# Patient Record
Sex: Male | Born: 1985 | Race: White | Hispanic: No | Marital: Single | Smoking: Current every day smoker
Health system: Southern US, Community
[De-identification: ages and names within clinical notes are randomized; demographics above are authoritative.]

## PROBLEM LIST (undated history)

## (undated) DIAGNOSIS — J45909 Unspecified asthma, uncomplicated: Secondary | ICD-10-CM

## (undated) DIAGNOSIS — L719 Rosacea, unspecified: Secondary | ICD-10-CM

## (undated) DIAGNOSIS — R45851 Suicidal ideations: Secondary | ICD-10-CM

## (undated) DIAGNOSIS — F1910 Other psychoactive substance abuse, uncomplicated: Secondary | ICD-10-CM

---

## 2013-04-23 ENCOUNTER — Emergency Department (HOSPITAL_COMMUNITY)
Admission: EM | Admit: 2013-04-23 | Discharge: 2013-04-23 | Disposition: A | Discharge status: Home / Self Care | Payer: Self-pay | Attending: Emergency Medicine | Admitting: Emergency Medicine

## 2013-04-23 ENCOUNTER — Encounter (HOSPITAL_COMMUNITY): Payer: Self-pay | Admitting: *Deleted

## 2013-04-23 DIAGNOSIS — L719 Rosacea, unspecified: Secondary | ICD-10-CM | POA: Insufficient documentation

## 2013-04-23 DIAGNOSIS — F17200 Nicotine dependence, unspecified, uncomplicated: Secondary | ICD-10-CM | POA: Insufficient documentation

## 2013-04-23 DIAGNOSIS — J45909 Unspecified asthma, uncomplicated: Secondary | ICD-10-CM | POA: Insufficient documentation

## 2013-04-23 HISTORY — DX: Rosacea, unspecified: L71.9

## 2013-04-23 HISTORY — DX: Unspecified asthma, uncomplicated: J45.909

## 2013-04-23 MED ORDER — METRONIDAZOLE 500 MG PO TABS: 500.0000 mg | ORAL_TABLET | Freq: Two times a day (BID) | ORAL | Status: DC

## 2013-04-23 NOTE — ED Provider Notes (Signed)
CSN: [id number]     Arrival date & time 04/23/13  4098 History   First MD Initiated Contact with Patient 04/23/13 [phone]     Chief Complaint  Patient presents with  . Rash   (Consider location/radiation/quality/duration/timing/severity/associated sxs/prior Treatment) HPI Comments: Patient presents to the ER for evaluation of facial rash. Patient reports that in the last few days he has noticed redness, swelling around the nose and cheeks. He has been diagnosed with rosacea before. Patient denies the rash being anywhere else. No new skin products. No trouble breathing.  Patient is a 27 y.o. male presenting with rash.  Rash Associated symptoms: no fever     Past Medical History  Diagnosis Date  . Asthma    History reviewed. No pertinent past surgical history. No family history on file. History  Substance Use Topics  . Smoking status: Current Every Day Smoker  . Smokeless tobacco: Not on file  . Alcohol Use: No    Review of Systems  Constitutional: Negative for fever.  Skin: Positive for rash.    Allergies  Review of patient's allergies indicates no known allergies.  Home Medications  No current outpatient prescriptions on file. BP 128/72  Pulse 52  Temp(Src) 97.4 F (36.3 C) (Oral)  Resp 18  SpO2 97% Physical Exam  Constitutional: He is oriented to person, place, and time. He appears well-developed and well-nourished. No distress.  HENT:  Head: Normocephalic and atraumatic.  Right Ear: Hearing normal.  Left Ear: Hearing normal.  Nose: Nose normal.  Mouth/Throat: Oropharynx is clear and moist and mucous membranes are normal.  Eyes: Conjunctivae and EOM are normal. Pupils are equal, round, and reactive to light.  Neck: Normal range of motion. Neck supple.  Cardiovascular: Regular rhythm, S1 normal and S2 normal.  Exam reveals no gallop and no friction rub.   No murmur heard. Pulmonary/Chest: Effort normal and breath sounds normal. No respiratory distress. He exhibits  no tenderness.  Abdominal: Soft. Normal appearance and bowel sounds are normal. There is no hepatosplenomegaly. There is no tenderness. There is no rebound, no guarding, no tenderness at McBurney's point and negative Murphy's sign. No hernia.  Musculoskeletal: Normal range of motion.  Neurological: He is alert and oriented to person, place, and time. He has normal strength. No cranial nerve deficit or sensory deficit. Coordination normal. GCS eye subscore is 4. GCS verbal subscore is 5. GCS motor subscore is 6.  Skin: Skin is warm, dry and intact. Rash (erythematous, papular rash on nose and malar area. No vesicles.) noted. No cyanosis.  Psychiatric: He has a normal mood and affect. His speech is normal and behavior is normal. Thought content normal.    ED Course  Procedures (including critical care time) Labs Review Labs Reviewed - No data to display Imaging Review No results found.  MDM  Diagnosis: Rosacea  Patient presents with facial rash and a history of rosacea. Rash does seem consistent with rosacea, will treat. Patient with no insurance, living in half-way house. Unlikely to be able to afford doxy - will use flagyl.    Gilda Crease, MD 04/23/13 [phone]

## 2013-04-23 NOTE — ED Notes (Signed)
Pt is here with facial rash  And has had this before and got anbx for it

## 2013-06-06 ENCOUNTER — Encounter (HOSPITAL_COMMUNITY): Payer: Self-pay | Admitting: Emergency Medicine

## 2013-06-06 ENCOUNTER — Emergency Department (HOSPITAL_COMMUNITY)
Admission: EM | Admit: 2013-06-06 | Discharge: 2013-06-06 | Disposition: A | Discharge status: Home / Self Care | Payer: Self-pay | Attending: Emergency Medicine | Admitting: Emergency Medicine

## 2013-06-06 DIAGNOSIS — F17200 Nicotine dependence, unspecified, uncomplicated: Secondary | ICD-10-CM | POA: Insufficient documentation

## 2013-06-06 DIAGNOSIS — Z872 Personal history of diseases of the skin and subcutaneous tissue: Secondary | ICD-10-CM | POA: Insufficient documentation

## 2013-06-06 DIAGNOSIS — Z79899 Other long term (current) drug therapy: Secondary | ICD-10-CM | POA: Insufficient documentation

## 2013-06-06 DIAGNOSIS — Z760 Encounter for issue of repeat prescription: Secondary | ICD-10-CM | POA: Insufficient documentation

## 2013-06-06 DIAGNOSIS — J45909 Unspecified asthma, uncomplicated: Secondary | ICD-10-CM | POA: Insufficient documentation

## 2013-06-06 MED ORDER — ALBUTEROL SULFATE HFA 108 (90 BASE) MCG/ACT IN AERS
2.0000 | INHALATION_SPRAY | RESPIRATORY_TRACT | Status: DC | PRN
  Administered 2013-06-06: 2 via RESPIRATORY_TRACT
  Filled 2013-06-06: qty 6.7

## 2013-06-06 NOTE — ED Notes (Signed)
Pt presents to the ED with asthma "flare up" and is currently out of his albuterol inhaler

## 2013-06-06 NOTE — ED Provider Notes (Signed)
CSN: [id number]     Arrival date & time 06/06/13  0802 History   First MD Initiated Contact with Patient 06/06/13 0805     Chief Complaint  Patient presents with  . Asthma   (Consider location/radiation/quality/duration/timing/severity/associated sxs/prior Treatment) Patient is a 27 y.o. male presenting with asthma.  Asthma   Pt with history of asthma recently got out of prison and was given an inhaler when he was released. He reports he has been using his inhaler more frequently with recent weather change and has now run out. He does not yet have PCP and was advised to come here for evaluation. Denies symptoms now. Used his last puff this AM.   Past Medical History  Diagnosis Date  . Asthma   . Rosacea    No past surgical history on file. No family history on file. History  Substance Use Topics  . Smoking status: Current Every Day Smoker  . Smokeless tobacco: Not on file  . Alcohol Use: No    Review of Systems All other systems reviewed and are negative except as noted in HPI.   Allergies  Review of patient's allergies indicates no known allergies.  Home Medications   Current Outpatient Rx  Name  Route  Sig  Dispense  Refill  . albuterol (PROVENTIL HFA;VENTOLIN HFA) 108 (90 BASE) MCG/ACT inhaler   Inhalation   Inhale 2 puffs into the lungs every 6 (six) hours as needed for wheezing.         . metroNIDAZOLE (FLAGYL) 500 MG tablet   Oral   Take 1 tablet (500 mg total) by mouth 2 (two) times daily.   20 tablet   0    BP 113/66  Pulse 66  SpO2 100% Physical Exam  Nursing note and vitals reviewed. Constitutional: He is oriented to person, place, and time. He appears well-developed and well-nourished.  HENT:  Head: Normocephalic and atraumatic.  Eyes: EOM are normal. Pupils are equal, round, and reactive to light.  Neck: Normal range of motion. Neck supple.  Cardiovascular: Normal rate, normal heart sounds and intact distal pulses.   Pulmonary/Chest: Effort  normal and breath sounds normal. He has no wheezes. He has no rales.  Abdominal: Bowel sounds are normal. He exhibits no distension. There is no tenderness.  Musculoskeletal: Normal range of motion. He exhibits no edema and no tenderness.  Neurological: He is alert and oriented to person, place, and time. He has normal strength. No cranial nerve deficit or sensory deficit.  Skin: Skin is warm and dry. No rash noted.  Psychiatric: He has a normal mood and affect.    ED Course  Procedures (including critical care time) Labs Review Labs Reviewed - No data to display Imaging Review No results found.  EKG Interpretation   None       MDM   1. Medication refill   2. Asthma     Given albuterol HFA here and referral to [facility] for long term management.     Joniya Boberg B. Bernette Mayers, MD 06/06/13 [phone]

## 2013-06-06 NOTE — ED Notes (Signed)
Pt reports he is out of his albuterol inhaler, does not have a PCP to get a refill of his inhaler.

## 2013-06-06 NOTE — ED Notes (Signed)
Pt discharged home with all belongings, alert and ambulatory upon discharge, 1 new RX given, pt verbalizes understanding of discharge instructions, driven home by friend at bedside

## 2013-06-29 ENCOUNTER — Emergency Department (HOSPITAL_COMMUNITY)
Admission: EM | Admit: 2013-06-29 | Discharge: 2013-06-29 | Disposition: A | Discharge status: Home / Self Care | Payer: Self-pay | Attending: Emergency Medicine | Admitting: Emergency Medicine

## 2013-06-29 ENCOUNTER — Encounter (HOSPITAL_COMMUNITY): Payer: Self-pay | Admitting: Emergency Medicine

## 2013-06-29 DIAGNOSIS — Z872 Personal history of diseases of the skin and subcutaneous tissue: Secondary | ICD-10-CM | POA: Insufficient documentation

## 2013-06-29 DIAGNOSIS — J45909 Unspecified asthma, uncomplicated: Secondary | ICD-10-CM | POA: Insufficient documentation

## 2013-06-29 DIAGNOSIS — F17200 Nicotine dependence, unspecified, uncomplicated: Secondary | ICD-10-CM | POA: Insufficient documentation

## 2013-06-29 DIAGNOSIS — Z760 Encounter for issue of repeat prescription: Secondary | ICD-10-CM | POA: Insufficient documentation

## 2013-06-29 MED ORDER — ALBUTEROL SULFATE HFA 108 (90 BASE) MCG/ACT IN AERS
2.0000 | INHALATION_SPRAY | RESPIRATORY_TRACT | Status: DC | PRN
  Administered 2013-06-29: 2 via RESPIRATORY_TRACT
  Filled 2013-06-29: qty 6.7

## 2013-06-29 MED ORDER — ALBUTEROL SULFATE HFA 108 (90 BASE) MCG/ACT IN AERS
2.0000 | INHALATION_SPRAY | RESPIRATORY_TRACT | Status: AC | PRN
Start: 2013-06-29 — End: ?

## 2013-06-29 NOTE — ED Provider Notes (Signed)
CSN: [id number]     Arrival date & time 06/29/13  2103 History   First MD Initiated Contact with Patient 06/29/13 2256     Chief Complaint  Patient presents with  . Medication Refill   (Consider location/radiation/quality/duration/timing/severity/associated sxs/prior Treatment) HPI  27 year old male presents requesting for refill of albuterol inhaler.  Sts he has hx of asthma, uses albuterol inhaler on a daily basis.  Ran out of inhaler since yesterday and currently waiting to get paid on Monday in order to pay for inhaler refill, which he did not receive.  He currently denies any active sxs including no fever, chills, cp, sob, productive cough or rash.  Is a smoker.  No prior intubation or ICU stay.  No other complaint.    Past Medical History  Diagnosis Date  . Asthma   . Rosacea    History reviewed. No pertinent past surgical history. History reviewed. No pertinent family history. History  Substance Use Topics  . Smoking status: Current Every Day Smoker  . Smokeless tobacco: Not on file  . Alcohol Use: No    Review of Systems  Constitutional: Negative for fever.  Respiratory: Negative for cough, chest tightness and shortness of breath.   Cardiovascular: Negative for chest pain.  Skin: Negative for rash.    Allergies  Review of patient's allergies indicates no known allergies.  Home Medications  No current outpatient prescriptions on file. BP 121/66  Pulse 64  Temp(Src) 97.5 F (36.4 C) (Oral)  Resp 18  Wt 156 lb 1 oz (70.789 kg)  SpO2 97% Physical Exam  Nursing note and vitals reviewed. Constitutional: He appears well-developed and well-nourished.  HENT:  Head: Atraumatic.  Mouth/Throat: Oropharynx is clear and moist.  Eyes: Conjunctivae are normal.  Neck: Normal range of motion. Neck supple.  Cardiovascular: Normal rate and regular rhythm.   Pulmonary/Chest: Effort normal and breath sounds normal. He has no wheezes. He exhibits no tenderness.  Neurological:  He is alert.  Skin: No rash noted.  Psychiatric: He has a normal mood and affect.    ED Course  Procedures (including critical care time)  11:35 PM Pt here requesting for albuterol HFA refill.  No active respiratory illness.  Will give inhaler here, give prescription along with list of resources.  Smoking cessation discussed.    Labs Review Labs Reviewed - No data to display Imaging Review No results found.  EKG Interpretation   None       MDM   1. Encounter for medication refill    BP 121/66  Pulse 64  Temp(Src) 97.5 F (36.4 C) (Oral)  Resp 18  Wt 156 lb 1 oz (70.789 kg)  SpO2 97%     Fayrene Helper, PA-C 06/29/13 2338

## 2013-06-29 NOTE — ED Provider Notes (Signed)
Medical screening examination/treatment/procedure(s) were performed by non-physician practitioner and as supervising physician I was immediately available for consultation/collaboration.   Charles B. Sheldon, MD 06/29/13 2355 

## 2013-06-29 NOTE — ED Notes (Addendum)
Presents with requesting refill on albuterol inhaler and new hand held inhaler until he can pay for one on Monday. Bilateral breath sounds clear.  Speaking in full sentences, sats WNL, RR WNL

## 2013-08-24 ENCOUNTER — Emergency Department (HOSPITAL_COMMUNITY)
Admission: EM | Admit: 2013-08-24 | Discharge: 2013-08-24 | Disposition: A | Discharge status: Home / Self Care | Payer: Self-pay | Attending: Emergency Medicine | Admitting: Emergency Medicine

## 2013-08-24 ENCOUNTER — Encounter (HOSPITAL_COMMUNITY): Payer: Self-pay | Admitting: Emergency Medicine

## 2013-08-24 DIAGNOSIS — J45901 Unspecified asthma with (acute) exacerbation: Secondary | ICD-10-CM

## 2013-08-24 DIAGNOSIS — F17200 Nicotine dependence, unspecified, uncomplicated: Secondary | ICD-10-CM | POA: Insufficient documentation

## 2013-08-24 DIAGNOSIS — Z872 Personal history of diseases of the skin and subcutaneous tissue: Secondary | ICD-10-CM | POA: Insufficient documentation

## 2013-08-24 DIAGNOSIS — J45909 Unspecified asthma, uncomplicated: Secondary | ICD-10-CM | POA: Insufficient documentation

## 2013-08-24 MED ORDER — ALBUTEROL SULFATE HFA 108 (90 BASE) MCG/ACT IN AERS
2.0000 | INHALATION_SPRAY | Freq: Once | RESPIRATORY_TRACT | Status: AC
  Administered 2013-08-24: 2 via RESPIRATORY_TRACT
  Filled 2013-08-24: qty 6.7

## 2013-08-24 MED ORDER — ALBUTEROL SULFATE HFA 108 (90 BASE) MCG/ACT IN AERS: 1.0000 | INHALATION_SPRAY | Freq: Four times a day (QID) | RESPIRATORY_TRACT | Status: DC | PRN

## 2013-08-24 NOTE — Discharge Instructions (Signed)
Follow up with a doctor from the resource guide below. Return to the ED with worsening or concerning symptoms. Use albuterol inhaler as needed.    Emergency Department Resource Guide 1) Find a Doctor and Pay Out of Pocket Although you won't have to find out who is covered by your insurance plan, it is a good idea to ask around and get recommendations. You will then need to call the office and see if the doctor you have chosen will accept you as a new patient and what types of options they offer for patients who are self-pay. Some doctors offer discounts or will set up payment plans for their patients who do not have insurance, but you will need to ask so you aren't surprised when you get to your appointment.  2) Contact Your Local Health Department Not all health departments have doctors that can see patients for sick visits, but many do, so it is worth a call to see if yours does. If you don't know where your local health department is, you can check in your phone book. The CDC also has a tool to help you locate your state's health department, and many state websites also have listings of all of their local health departments.  3) Find a Walk-in Clinic If your illness is not likely to be very severe or complicated, you may want to try a walk in clinic. These are popping up all over the country in pharmacies, drugstores, and shopping centers. They're usually staffed by nurse practitioners or physician assistants that have been trained to treat common illnesses and complaints. They're usually fairly quick and inexpensive. However, if you have serious medical issues or chronic medical problems, these are probably not your best option.  No Primary Care Doctor: - Call Health Connect at  [phone] - they can help you locate a primary care doctor that  accepts your insurance, provides certain services, etc. - Physician Referral Service- [phone]  Chronic Pain Problems: Organization          Address  Phone   Notes  [facility]  [phone] Patients need to be referred by their primary care doctor.   Medication Assistance: Organization         Address  Phone   Notes  [facility] [address] [place], [place] [address] [phone] --Must be a resident of [facility] -- Must have NO insurance coverage whatsoever (no Medicaid/ Medicare, etc.) -- The pt. MUST have a primary care doctor that directs their care regularly and follows them in the community   [facility]ist  [phone]   [employer]  [phone]    Agencies that provide inexpensive medical care: Organization         Address  Phone   Notes  [facility]  [phone]   [facility]    [phone]   [facility] [address] [place], [place] [address] [phone]   [facility] [address], [place] [phone]   [facility]    [phone]   [facility]    [phone]   [facility]  [address], [place] Phone:  [phone], Fax:  [phone] Hours of Operation:  9 am - 6 pm, M-F.  Also accepts Medicaid/Medicare and self-pay.  [facility]  [address], [place]  Phone: [phone], Fax: [phone]. Hours of Operation:  8:30 am - 5:30 pm, M-F.  Also accepts Medicaid and self-pay.  °[facility] [place] [address], [place] Phone: [phone]   °[facility] [address], [place], [place] [phone], Ext. 123 Mondays & Thursdays: 7-9 AM.  First 15 patients are seen on a first come, first serve basis. °  ° °Medicaid-accepting [place] Providers: ° °Organization         Address  Phone   Notes  °[facility] [address], [place] [phone] Also accepts self-pay patients.  °[facility] [address], [place] ° [phone]) [phone]   °[facility] [address], [place] [phone]) [phone]   °[facility] [address] [place] [address], [place] [phone]) [phone]   °Veita Bland [address], [place]  ° [phone]) [phone] Only accepts [place] Access Medicaid patients after they have their name applied to their card.  ° °Self-Pay (no insurance) in [place]: ° °Organization         Address  Phone   Notes  °Sickle Cell Patients, [facility] [address], [place] [phone]) [phone]   °[facility] [address], [place] [phone]) [phone]   °[facility] ° [address], [place] [phone]) [phone]   °[facility]/Dr. Osei-Bonsu ° [address] [place] [address], [place] or [address], [place] [phone] Phone number for both [place] and [place] locations is the same.  °[facility] [address], [place] [phone]   °[facility] [address] [place] [address], [place] or [address] ([phone]) [phone] °([phone]) [phone]   °[facility] [address], [place] ([phone]) [phone], phone; [phone], fax Sees patients 1st and 3rd Saturday of every month.  Must not qualify for public or private insurance (i.e. Medicaid, Medicare, [place] Health Choice, Veterans' Benefits) • Household income should be no more than 200% of the poverty level •The clinic cannot treat you if you are pregnant or think you are pregnant • Sexually transmitted diseases are not treated at the clinic.  ° ° °Dental Care: °Organization         Address  Phone  Notes  °[facility] [facility] [address], [place] [phone]) [phone] Accepts children up to age 21 who are enrolled in Medicaid or [place] Health Choice; pregnant women with a Medicaid card; and  children who have applied for Medicaid or [place] Health Choice, but were declined, whose parents can pay a reduced fee at time of service.  °[facility] [place]  [address], [place] [phone]) [phone] Accepts children up to age 21 who are enrolled in Medicaid or [place] Health Choice; pregnant women with a Medicaid card; and children who have applied for Medicaid or  Health Choice, but were declined, whose parents can pay a reduced fee at time of service.  °[facility] ° [address],  ([phone]) [phone] Patients are seen by appointment only. Walk-ins are not accepted. [facility] will see patients 18 years of age and older. °Monday - Tuesday (8am-5pm) °Most Wednesdays (8:30-5pm) °$30 per visit, cash only  °[facility] ° [address], [facility] [phone] Patients are seen by appointment only. Walk-ins are not accepted. [place] will see patients 23 years of age and older. One Wednesday Evening (Monthly: Volunteer Based).  $30 per visit, cash only  [place]  [phone] for adults; Children under age 68, call Graduate Pediatric Dentistry at [phone]. Children aged 23-14, please call [phone] to request a pediatric application.  Dental services are provided in all areas of dental care including fillings, crowns and bridges, complete and partial dentures, implants, gum treatment, root canals, and extractions. Preventive care is also provided. Treatment is provided to both adults and children. Patients are selected via a lottery and there is often a waiting list.   [facility] [address] [place]  [phone] [email or web address]   [facility] [address] [place], [place] [phone], Ext. [phone] Second and Fourth Thursday of each month, opens at 6:30 AM; Clinic ends at 9 AM.  Patients are seen on a first-come first-served  basis, and a limited number are seen during each clinic.   [facility]  [address] Hillard Danker [place], [place] [phone]   Eligibility Requirements You must have lived in [place], [place], or [place] counties for at least the last three months.   You cannot be eligible for state or federal sponsored [employer], including [employer], [place], or [employer].   You generally cannot be eligible for healthcare insurance through your employer.    How to apply: Eligibility screenings are held every Tuesday and Wednesday afternoon from 1:00 pm until 4:00 pm. You do not need an appointment for the interview!  [facility] [address], [place], [place]   [place]  [place]  [phone]    Behavioral Health Resources in the Community: Intensive Outpatient Programs Organization         Address  Phone  Notes  [facility] [address], [place], [place] [phone]   [facility] [address] [place], [place]   [facility] [address] [place], [place]   [place] [address]  [place], [place] or [phone]   Substance Abuse Resources Organization         Address  Phone  Notes  [facility]  [phone]) [phone]   [place]  ([phone]) [phone]   [facility] [place]   Chinita Pester  [phone]   Residential & Outpatient Substance Abuse Program  ([phone]   Psychological Services Organization         Address  Phone  Notes  [facility] [place]  [place]  ([phone]) [phone]   [place] [address], [place] or [phone]    Mobile Crisis Teams Organization          Address  Phone  Notes  [facility], Mobile Crisis Care Unit  [phone]   [facility]  [address] [employer] [employer], [place]   [place] [address] [place] [phone]    Self-Help/Support Groups Organization         Address  Phone             Notes  [place] [facility] [place] - variety of  support groups  336- [phone] Call for more information  °Narcotics Anonymous (NA), [facility] [address], °[place]  2 meetings at this location  ° °Residential Treatment Programs °Organization         Address  Phone  Notes  °[facility] [address],    °[place]  [phone]   °[facility] ° [address], [place], [place] [phone]   °[facility] [address], [place] [phone] Admissions: 8am-3pm M-F  °[facility] [address]    °[place], [place] [phone]   °[facility] [address], [place], [place] [phone]   °[facility] [address]  °[place], [place] [phone]   °[facility] - Intensive Outpatient [address], [place], [place] [phone]   °[facility] [address]  °[place], [place] 1-877-615-2722 or [phone]   °[facility] ([facility]) [address] [place], [place] [phone] Accepts Medicaid  °[facility] [address]  °[place] 1-800-659-3381 Substance Abuse/Addiction Treatment  ° °[place] Behavioral Health Resources °Organization         Address  Phone  Notes  °[facility]  (888) [phone]   °Julie Brannon, PhD [address] [place], [place]   (336) [phone] or (336) 951-0000   °[facility]   [address] °[place], [place] (336) 349-4454   °[facility] [address], [place], [place] (336) [phone] Insurance/Medicaid/sponsorship  through [facility]  °[facility] [address]                                    [place], [place] (336) [phone] Therapy/tele-psych/case  °[facility] [address]  ° [place], [name] (336) [phone]    °Dr. Arfeen  (336) [phone]   °[facility] [place]  [employer] [place] [facility] 1) [address], [place] °2) [address], [place] °3)  [address], [place] (336) [phone] °(336) [phone] ° °(336) 342-8140   °[place] Child Abuse Hotline (336) 342-1394 or (336) 342-3537 (After Hours)    ° ° ° °

## 2013-08-24 NOTE — ED Notes (Signed)
RN went into patients room x3 times and patient was not in room. RN called for patient in waiting room x3 times. Patient did not answer. RN moved patient to OTF.

## 2013-08-24 NOTE — ED Provider Notes (Signed)
CSN: [id number]     Arrival date & time 08/24/13  1907 History  This chart was scribed for non-physician practitioner Emilia BeckKaitlyn Carloyn Lahue, PA-C working with Audree CamelScott T Goldston, MD by Danella Maiersaroline Early, ED Scribe. This patient was seen in room TR07C/TR07C and the patient's care was started at 7:27 PM.    Chief Complaint  Patient presents with  . wants albuterol inhaler    The history is provided by the patient. No language interpreter was used.   HPI Comments: Frank Patterson is a 28 y.o. male with a h/o asthma who presents to the Emergency Department for a refill on his albuterol inhaler.  He states he ran out today. He denies any wheezing, SOB, cough. He states he does not have a PCP and he comes here for his inhalers.    Past Medical History  Diagnosis Date  . Asthma   . Rosacea    History reviewed. No pertinent past surgical history. No family history on file. History  Substance Use Topics  . Smoking status: Current Every Day Smoker  . Smokeless tobacco: Not on file  . Alcohol Use: No    Review of Systems  Constitutional: Negative for fever, chills and fatigue.  HENT: Negative for trouble swallowing.   Eyes: Negative for visual disturbance.  Respiratory: Negative for cough, shortness of breath and wheezing.   Cardiovascular: Negative for chest pain and palpitations.  Gastrointestinal: Negative for nausea, vomiting, abdominal pain and diarrhea.  Endocrine: Negative for heat intolerance.  Genitourinary: Negative for dysuria and difficulty urinating.  Musculoskeletal: Negative for arthralgias and neck pain.  Skin: Negative for color change.  Neurological: Negative for dizziness and weakness.  Psychiatric/Behavioral: Negative for dysphoric mood.    Allergies  Review of patient's allergies indicates no known allergies.  Home Medications   Current Outpatient Rx  Name  Route  Sig  Dispense  Refill  . albuterol (PROVENTIL HFA;VENTOLIN HFA) 108 (90 BASE) MCG/ACT inhaler    Inhalation   Inhale 2 puffs into the lungs every 4 (four) hours as needed for wheezing or shortness of breath.   1 each   0    BP 123/66  Pulse 66  Temp(Src) 97.7 F (36.5 C)  Resp 18  Ht 5\' 6"  (1.676 m)  Wt 151 lb (68.493 kg)  BMI 24.38 kg/m2  SpO2 98% Physical Exam  Nursing note and vitals reviewed. Constitutional: He is oriented to person, place, and time. He appears well-developed and well-nourished. No distress.  HENT:  Head: Normocephalic and atraumatic.  Eyes: EOM are normal.  Neck: Neck supple. No tracheal deviation present.  Cardiovascular: Normal rate.   Pulmonary/Chest: Effort normal. No respiratory distress. He has no wheezes.  Musculoskeletal: Normal range of motion.  Neurological: He is alert and oriented to person, place, and time.  Skin: Skin is warm and dry.  Psychiatric: He has a normal mood and affect. His behavior is normal.    ED Course  Procedures (including critical care time) Medications - No data to display  DIAGNOSTIC STUDIES: Oxygen Saturation is 98% on RA, normal by my interpretation.    COORDINATION OF CARE: 8:44 PM- Discussed treatment plan with pt which includes discharge home with inhaler. Pt agrees to plan.    Labs Review Labs Reviewed - No data to display Imaging Review No results found.  EKG Interpretation   None       MDM   1. Asthma attack    Patient will have albuterol inhaler. Patient has no wheezing on exam. Vitals  stable and patient afebrile. Patient given a Facilities manager.   I personally performed the services described in this documentation, which was scribed in my presence. The recorded information has been reviewed and is accurate.    Emilia Beck, [place] 08/24/13 2113

## 2013-08-24 NOTE — ED Provider Notes (Signed)
Medical screening examination/treatment/procedure(s) were performed by non-physician practitioner and as supervising physician I was immediately available for consultation/collaboration.  EKG Interpretation   None         Salih Williamson T Della Homan, MD 08/24/13 2332 

## 2013-08-24 NOTE — ED Notes (Signed)
The pt is asking for an albuterol inhaler.  He reports that hecomes here and usually gets an inhaler and does not have the money to get a rx filled until he gets paid

## 2013-09-12 ENCOUNTER — Encounter (HOSPITAL_COMMUNITY): Payer: Self-pay | Admitting: Emergency Medicine

## 2013-09-12 ENCOUNTER — Emergency Department (HOSPITAL_COMMUNITY)
Admission: EM | Admit: 2013-09-12 | Discharge: 2013-09-12 | Disposition: A | Discharge status: Home / Self Care | Payer: Self-pay | Attending: Emergency Medicine | Admitting: Emergency Medicine

## 2013-09-12 DIAGNOSIS — F17200 Nicotine dependence, unspecified, uncomplicated: Secondary | ICD-10-CM | POA: Insufficient documentation

## 2013-09-12 DIAGNOSIS — Z760 Encounter for issue of repeat prescription: Secondary | ICD-10-CM | POA: Insufficient documentation

## 2013-09-12 DIAGNOSIS — Z872 Personal history of diseases of the skin and subcutaneous tissue: Secondary | ICD-10-CM | POA: Insufficient documentation

## 2013-09-12 DIAGNOSIS — J45909 Unspecified asthma, uncomplicated: Secondary | ICD-10-CM | POA: Insufficient documentation

## 2013-09-12 DIAGNOSIS — Z79899 Other long term (current) drug therapy: Secondary | ICD-10-CM | POA: Insufficient documentation

## 2013-09-12 MED ORDER — ALBUTEROL SULFATE HFA 108 (90 BASE) MCG/ACT IN AERS
2.0000 | INHALATION_SPRAY | Freq: Once | RESPIRATORY_TRACT | Status: AC
  Administered 2013-09-12: 2 via RESPIRATORY_TRACT
  Filled 2013-09-12: qty 6.7

## 2013-09-12 NOTE — ED Notes (Signed)
Pt requesting a medication refill for his albuterol inhaler, states he does not have a pcp and is currently out of his inhaler

## 2013-09-12 NOTE — Discharge Instructions (Signed)
Use albuterol inhaler as needed for shortness of breath. Follow up with a primary care provider from the resource guide for further evaluation and management.    Emergency Department Resource Guide 1) Find a Doctor and Pay Out of Pocket Although you won't have to find out who is covered by your insurance plan, it is a good idea to ask around and get recommendations. You will then need to call the office and see if the doctor you have chosen will accept you as a new patient and what types of options they offer for patients who are self-pay. Some doctors offer discounts or will set up payment plans for their patients who do not have insurance, but you will need to ask so you aren't surprised when you get to your appointment.  2) Contact Your Local Health Department Not all health departments have doctors that can see patients for sick visits, but many do, so it is worth a call to see if yours does. If you don't know where your local health department is, you can check in your phone book. The CDC also has a tool to help you locate your state's health department, and many state websites also have listings of all of their local health departments.  3) Find a Walk-in Clinic If your illness is not likely to be very severe or complicated, you may want to try a walk in clinic. These are popping up all over the country in pharmacies, drugstores, and shopping centers. They're usually staffed by nurse practitioners or physician assistants that have been trained to treat common illnesses and complaints. They're usually fairly quick and inexpensive. However, if you have serious medical issues or chronic medical problems, these are probably not your best option.  No Primary Care Doctor: - Call Health Connect at  [phone] - they can help you locate a primary care doctor that  accepts your insurance, provides certain services, etc. - Physician Referral Service- [phone]  Chronic Pain Problems: Organization          Address  Phone   Notes  [facility]  [phone] Patients need to be referred by their primary care doctor.   Medication Assistance: Organization         Address  Phone   Notes  [facility] Medication [facility] [address] [place], [place] [address] [phone] --Must be a resident of [facility] -- Must have NO insurance coverage whatsoever (no Medicaid/ Medicare, etc.) -- The pt. MUST have a primary care doctor that directs their care regularly and follows them in the community   MedAssist  [phone]   [employer]  [phone]    Agencies that provide inexpensive medical care: Organization         Address  Phone   Notes  [facility]  [phone]   [facility]    [phone]   [facility] [address] [place], [place] [address] [phone]   [facility] [address] [place], [place] [phone]) [phone]   Planned Parenthood    [phone]   [facility]    [phone]   [facility]  [address], [place] Phone:  [phone], Fax:  [phone] Hours of Operation:  9 am - 6 pm, M-F.  Also accepts Medicaid/Medicare and self-pay.  [facility]  [address],  [place] Phone: [phone](336) [phone], Fax: [phone]) [phone]. Hours of Operation:  8:30 am - 5:30 pm, M-F.  Also accepts Medicaid and self-pay.  [facility] [place] [address], [place] Phone: [phone]) [phone]   [facility] [address], [place], [place] [phone], Ext. [phone] Mondays & Thursdays: 7-9 AM.  First 15 patients are seen on a first come, first serve basis.    Medicaid-accepting [facility] Providers:  Organization         Address  Phone   Notes  [facility] [address], [place] [phone]) [phone] Also accepts self-pay patients.    [facility] [address], [place]  [phone]   [facility] [address], [place] [phone](336) [phone]   [facility] [address] [place] [address], [place] [phone]   Renaye RakersVeita Bland [address], [place]   [phone] Only accepts [place] Access [place]icaid patients after they have their name applied to their card.   Self-Pay (no insurance) in [facility] [place]:  Organization         Address  Phone   Notes  Sickle Cell Patients, [facility] [address], [place] [phone]) [phone]   [facility] [address], [place] [phone]   [facility] [place]  [address], [place] [phone]   [facility]/Dr. Osei-Bonsu  [address] [place] [address], [place] or [phone], [place] [phone] Phone number for both [place] and [place] locations is the same.  [facility] [address], [place] [phone]) [phone]   [facility] [place] [address] [place] [address], [place] or [address] [phone]   [facility] [address], [place] [phone]) [phone], phone; [phone], fax Sees patients 1st and 3rd Saturday of every month.  Must not qualify for public or private insurance (i.e. Medicaid, Medicare, [place] Health Choice, Veterans' Benefits)  Household income should be no more than 200% of the poverty level The clinic cannot treat you if you are pregnant or think you are pregnant  Sexually transmitted diseases are not treated at the clinic.    Dental Care: Organization         Address  Phone  Notes  [facility] Department of [facility] [address], [place] [phone] Accepts children up to age 29 who are enrolled in [place]icaid or [place] Health Choice; pregnant women with a Medicaid  card; and children who have applied for Medicaid or [place] Health Choice, but were declined, whose parents can pay a reduced fee at time of service.  [name] of [facility] [place]  [address], [place] [phone] Accepts children up to age 10821 who are enrolled in [place]icaid or [place] Health Choice; pregnant women with a Medicaid card; and children who have applied for Medicaid or [place] Health Choice, but were declined, whose parents can pay a reduced fee at time of service.  [facility]  [address], [place] [phone] Patients are seen by appointment only. Walk-ins are not accepted. [facility] will see patients 29 years of age and older. Monday - Tuesday (8am-5pm) Most Wednesdays (8:30-5pm) $30 per visit, cash only  [facility]  [address], [facility] [phone]) [phone] Patients are seen by appointment only. Walk-ins are not accepted. [facility] will see patients 29 years of age and older. One Wednesday Evening (Monthly: Volunteer Based).  $30 per visit, cash only  [employer] [facility] [facility]  [phone] for adults; Children under age 724, call Graduate Pediatric Dentistry at [phone]. Children aged 244-14, please call [phone] to request a pediatric application.  Dental services are provided in all areas of dental care including fillings, crowns and bridges, complete and partial dentures, implants, gum treatment, root canals, and extractions. Preventive care is also provided. Treatment is provided to both adults and children. Patients are selected via a lottery and there is often a waiting list.   [facility] [address], [place]  [phone] [email or web address]   [facility] [address], [place], [place] [phone], Ext. [phone] Second and Fourth Thursday of each month, opens at 6:30 AM; Clinic ends at 9 AM.  Patients are seen on a first-come  first-served basis, and a limited number are seen during each clinic.   [facility]  [address], [place], [place] [phone]) [phone]   Eligibility Requirements You must have lived in [place], [place], or [place] counties for at least the last three months.   You cannot be eligible for state or federal sponsored National Cityhealthcare insurance, including CIGNAVeterans Administration, [place]icaid, or [employer] EntertainmentMedicare.   You generally cannot be eligible for healthcare insurance through your employer.    How to apply: Eligibility screenings are held every Tuesday and Wednesday afternoon from 1:00 pm until 4:00 pm. You do not need an appointment for the interview!  [facility] [address], [place], [place] [phone]   [facility]  [phone]   [facility]  [phone]   [facility]  [phone]    Behavioral Health Resources in the Community: Intensive Outpatient Programs Organization         Address  Phone  Notes  [facility] [address], [place], [place] [phone]   [facility] [address], [place], [place] [phone]   [facility] S[facility] [address], [place], [place]  [phone]   [facility] [address],  [place], [place] [phone] or [phone]   Substance Abuse Resources Organization         Address  Phone  Notes  [facility] Drug Services  [phone]   [facility]  [phone]   [facility]  [phone]   [facility]  [phone]   Residential & Outpatient Substance Abuse Program  [phone]   Psychological Services Organization         Address  Phone  Notes  [facility]  [phone]- [phone]   [facility]  [phone]- [phone]   [facility] [address], [place] [phone] or [phone]    Mobile Crisis Teams Organization          Address  Phone  Notes  [facility], Mobile Crisis Care Unit  [phone]   [facility]  [address] [place], [place] [phone]   Doristine LocksSharon DeEsch [address] [place] [phone]    Self-Help/Support Groups Organization         Address  Phone             Notes  Mental Health Assoc. of [place] -  variety of support groups  [phone] Call for more information  Narcotics Anonymous (NA), Caring [facility] [address], [place]  2 meetings at this location   Residential Facilities manager         Address  Phone  Notes  [facility] [place],    [place]  [phone]   [name]  [address] [place] [id number], [place], [place]   [place], [place] [phone] Admissions: 8am-3pm M-F  Incentives Substance [place] [address]    [place], [place] [phone]   [facility] [address] [place], [place], [place]   [facility] [address]  [place], [place]   Insight Programs - Intensive Outpatient [address], [place], [place]   [facility] [address].,  [place], [place] [phone] or [phone]   [facility] (RTS) [address], [place], [place] Accepts Medicaid  [facility] [place] [address],  [place] [phone] Substance Abuse/Addiction Treatment   [facility] Organization         Address  Phone  Notes  [facility]  [phone]   Domenic Schwab, PhD [address] [place], [place]   [phone] or [phone]   [place] [address] [place], [place] [phone]   [facility] [address] [place], [place] [phone] Insurance/Medicaid/sponsorship  through [facility] and Families [address] [place]                                    [place], [place] [phone] [place] [address], [place] [phone]    Dr. Adele Schilder  [phone]   [facility] Dept. 1) [address]  2) [place] 3)  [place] [address], [place] [phone]  [phone]   [place] [phone] or [phone] (After Hours)

## 2013-09-12 NOTE — ED Provider Notes (Signed)
CSN: [id number]     Arrival date & time 09/12/13  1119 History   This chart was scribed for non-physician practitioner Emilia BeckKaitlyn Virgel Haro, PA-C, working with Shon Batonourtney F Horton, MD, by Yevette EdwardsAngela Bracken, ED Scribe. This patient was seen in room TR06C/TR06C and the patient's care was started at 12:26 PM.  First MD Initiated Contact with Patient 09/12/13 1150     Chief Complaint  Patient presents with  . Medication Refill    The history is provided by the patient. No language interpreter was used.   HPI Comments: Frank Patterson is a 28 y.o. Male, with a h/o asthma, who presents to the Emergency Department for a medication refill for albuterol inhaler. The pt denies a current asthma attack.  Frank Patterson is a current smoker.   He does not have a PCP.   Past Medical History  Diagnosis Date  . Asthma   . Rosacea    History reviewed. No pertinent past surgical history. History reviewed. No pertinent family history. History  Substance Use Topics  . Smoking status: Current Every Day Smoker  . Smokeless tobacco: Not on file  . Alcohol Use: No    Review of Systems  Constitutional: Negative for fever.  Respiratory: Negative for shortness of breath.   All other systems reviewed and are negative.   Allergies  Review of patient's allergies indicates no known allergies.  Home Medications   Current Outpatient Rx  Name  Route  Sig  Dispense  Refill  . albuterol (PROVENTIL HFA;VENTOLIN HFA) 108 (90 BASE) MCG/ACT inhaler   Inhalation   Inhale 2 puffs into the lungs every 4 (four) hours as needed for wheezing or shortness of breath.   1 each   0   . albuterol (PROVENTIL HFA;VENTOLIN HFA) 108 (90 BASE) MCG/ACT inhaler   Inhalation   Inhale 1-2 puffs into the lungs every 6 (six) hours as needed for wheezing or shortness of breath.   1 Inhaler   0    Triage Vitals: BP 125/64  Pulse 64  Temp(Src) 97.4 F (36.3 C) (Oral)  Resp 18  SpO2 98%  Physical Exam  Nursing note and vitals  reviewed. Constitutional: He is oriented to person, place, and time. He appears well-developed and well-nourished. No distress.  HENT:  Head: Normocephalic and atraumatic.  Eyes: EOM are normal.  Neck: Neck supple. No tracheal deviation present.  Cardiovascular: Normal rate.   Pulmonary/Chest: Effort normal and breath sounds normal. No respiratory distress. He has no wheezes. He has no rales.  Musculoskeletal: Normal range of motion.  Neurological: He is alert and oriented to person, place, and time.  Skin: Skin is warm and dry.  Psychiatric: He has a normal mood and affect. His behavior is normal.    ED Course  Procedures (including critical care time)  DIAGNOSTIC STUDIES: Oxygen Saturation is 98% on room air, normal by my interpretation.    COORDINATION OF CARE:  12:27 PM- Discussed treatment plan with patient, which includes a renewal of medication, and the patient agreed to the plan.   Labs Review Labs Reviewed - No data to display Imaging Review No results found.  EKG Interpretation   None       MDM   1. Medication refill    12:31 PM Patient given albuterol inhaler for SOB. Patient will have resource guide for PCP follow up. Vitals stable and patient afebrile.   I personally performed the services described in this documentation, which was scribed in my presence. The recorded information has  been reviewed and is accurate.     Emilia Beck, [place] 09/12/13 1232

## 2013-09-12 NOTE — ED Provider Notes (Signed)
Medical screening examination/treatment/procedure(s) were performed by non-physician practitioner and as supervising physician I was immediately available for consultation/collaboration.  EKG Interpretation   None        Owin Vignola F Waldine Zenz, MD 09/12/13 1347 

## 2013-09-12 NOTE — ED Notes (Signed)
Patient states nothing is bothering him right now.   Nothing on assessment found to be an issue.   Patient states he doesn't have a primary doctor.

## 2013-10-15 ENCOUNTER — Emergency Department (HOSPITAL_COMMUNITY)
Admission: EM | Admit: 2013-10-15 | Discharge: 2013-10-15 | Disposition: A | Discharge status: Home / Self Care | Payer: Self-pay | Attending: Emergency Medicine | Admitting: Emergency Medicine

## 2013-10-15 ENCOUNTER — Encounter (HOSPITAL_COMMUNITY): Payer: Self-pay | Admitting: Emergency Medicine

## 2013-10-15 DIAGNOSIS — R3911 Hesitancy of micturition: Secondary | ICD-10-CM | POA: Insufficient documentation

## 2013-10-15 DIAGNOSIS — N401 Enlarged prostate with lower urinary tract symptoms: Secondary | ICD-10-CM | POA: Insufficient documentation

## 2013-10-15 DIAGNOSIS — K645 Perianal venous thrombosis: Secondary | ICD-10-CM | POA: Insufficient documentation

## 2013-10-15 DIAGNOSIS — IMO0002 Reserved for concepts with insufficient information to code with codable children: Secondary | ICD-10-CM | POA: Insufficient documentation

## 2013-10-15 DIAGNOSIS — J45909 Unspecified asthma, uncomplicated: Secondary | ICD-10-CM | POA: Insufficient documentation

## 2013-10-15 DIAGNOSIS — K644 Residual hemorrhoidal skin tags: Secondary | ICD-10-CM

## 2013-10-15 DIAGNOSIS — Z760 Encounter for issue of repeat prescription: Secondary | ICD-10-CM | POA: Insufficient documentation

## 2013-10-15 DIAGNOSIS — N138 Other obstructive and reflux uropathy: Secondary | ICD-10-CM | POA: Insufficient documentation

## 2013-10-15 DIAGNOSIS — Z872 Personal history of diseases of the skin and subcutaneous tissue: Secondary | ICD-10-CM | POA: Insufficient documentation

## 2013-10-15 DIAGNOSIS — Z79899 Other long term (current) drug therapy: Secondary | ICD-10-CM | POA: Insufficient documentation

## 2013-10-15 DIAGNOSIS — N400 Enlarged prostate without lower urinary tract symptoms: Secondary | ICD-10-CM

## 2013-10-15 DIAGNOSIS — F17200 Nicotine dependence, unspecified, uncomplicated: Secondary | ICD-10-CM | POA: Insufficient documentation

## 2013-10-15 LAB — URINALYSIS, ROUTINE W REFLEX MICROSCOPIC
Bilirubin Urine: NEGATIVE
GLUCOSE, UA: NEGATIVE mg/dL
Hgb urine dipstick: NEGATIVE
KETONES UR: NEGATIVE mg/dL
LEUKOCYTES UA: NEGATIVE
Nitrite: NEGATIVE
PROTEIN: NEGATIVE mg/dL
Specific Gravity, Urine: 1.021 (ref 1.005–1.030)
Urobilinogen, UA: 1 mg/dL (ref 0.0–1.0)
pH: 7 (ref 5.0–8.0)

## 2013-10-15 MED ORDER — HYDROCODONE-ACETAMINOPHEN 5-325 MG PO TABS: 1.0000 | ORAL_TABLET | ORAL | Status: DC | PRN

## 2013-10-15 MED ORDER — ALBUTEROL SULFATE HFA 108 (90 BASE) MCG/ACT IN AERS: 2.0000 | INHALATION_SPRAY | RESPIRATORY_TRACT | Status: DC | PRN

## 2013-10-15 MED ORDER — HYDROCODONE-ACETAMINOPHEN 5-325 MG PO TABS
2.0000 | ORAL_TABLET | Freq: Once | ORAL | Status: AC
  Administered 2013-10-15: 2 via ORAL
  Filled 2013-10-15: qty 2

## 2013-10-15 MED ORDER — HYDROCORTISONE 2.5 % RE CREA: TOPICAL_CREAM | RECTAL | Status: DC

## 2013-10-15 NOTE — ED Provider Notes (Signed)
CSN: [id number]     Arrival date & time 10/15/13  1438 History   First MD Initiated Contact with Patient 10/15/13 1627     Chief Complaint  Patient presents with  . Hemorrhoids     (Consider location/radiation/quality/duration/timing/severity/associated sxs/prior Treatment) The history is provided by the patient and medical records. No language interpreter was used.    Frank Patterson is a 28 y.o. male  with a hx of asthma presents to the Emergency Department complaining of gradual, persistent, progressively worsening hemorrhoids onset 2 weeks ago.  Pt reports Hx of constipation and external hemorrhoids in the past treated and resolved with outpatient medications.  Pt reports 2 days of diarrhea prior to the appearance of these hemorrhoids.  He is using preparation H and ibuprofen with temporary relief, but no resolution of the pain.  Pt reports continued pain with defecation, but no blood with defecation.  Sitting also makes the pain worse.  Pt denies fever, chills, headache, neck pain, CP, SOB, abd pain, N/V/D, weakness, dizziness, syncope.  Pt reports several months of intermittent urinary hesitancy with only occasional dysuria which he has not experienced for some time.  Pt denies insertion of objects into the rectum.  He is sexually active with one male partner and denies testicular pain, penile pain or penile discharge.     Past Medical History  Diagnosis Date  . Asthma   . Rosacea    History reviewed. No pertinent past surgical history. No family history on file. History  Substance Use Topics  . Smoking status: Current Every Day Smoker  . Smokeless tobacco: Not on file  . Alcohol Use: No    Review of Systems  Constitutional: Negative for fever, diaphoresis, appetite change, fatigue and unexpected weight change.  HENT: Negative for mouth sores.   Eyes: Negative for visual disturbance.  Respiratory: Negative for cough, chest tightness, shortness of breath and wheezing.    Cardiovascular: Negative for chest pain.  Gastrointestinal: Positive for rectal pain. Negative for nausea, vomiting, abdominal pain, diarrhea and constipation.  Endocrine: Negative for polydipsia, polyphagia and polyuria.  Genitourinary: Negative for dysuria, urgency, frequency and hematuria.       Hesitancy  Musculoskeletal: Negative for back pain and neck stiffness.  Skin: Negative for rash.  Allergic/Immunologic: Negative for immunocompromised state.  Neurological: Negative for syncope, light-headedness and headaches.  Hematological: Does not bruise/bleed easily.  Psychiatric/Behavioral: Negative for sleep disturbance. The patient is not nervous/anxious.       Allergies  Review of patient's allergies indicates no known allergies.  Home Medications   Current Outpatient Rx  Name  Route  Sig  Dispense  Refill  . albuterol (PROVENTIL HFA;VENTOLIN HFA) 108 (90 BASE) MCG/ACT inhaler   Inhalation   Inhale 2 puffs into the lungs every 4 (four) hours as needed for wheezing or shortness of breath.   1 each   0   . ibuprofen (ADVIL,MOTRIN) 200 MG tablet   Oral   Take 800 mg by mouth every 6 (six) hours as needed for mild pain.         Marland Kitchen albuterol (PROVENTIL HFA;VENTOLIN HFA) 108 (90 BASE) MCG/ACT inhaler   Inhalation   Inhale 2 puffs into the lungs every 4 (four) hours as needed for wheezing or shortness of breath.   1 Inhaler   3   . HYDROcodone-acetaminophen (NORCO/VICODIN) 5-325 MG per tablet   Oral   Take 1 tablet by mouth every 4 (four) hours as needed.   6 tablet   0   .  hydrocortisone (ANUSOL-HC) 2.5 % rectal cream      Apply rectally 2 times daily   30 g   0    BP 114/65  Pulse 60  Temp(Src) 97.8 F (36.6 C) (Oral)  Resp 18  SpO2 98% Physical Exam  Nursing note and vitals reviewed. Constitutional: He appears well-developed and well-nourished. No distress.  Awake, alert, nontoxic appearance  HENT:  Head: Normocephalic and atraumatic.  Mouth/Throat:  Oropharynx is clear and moist. No oropharyngeal exudate.  Eyes: Conjunctivae are normal. No scleral icterus.  Neck: Normal range of motion. Neck supple.  Cardiovascular: Normal rate, regular rhythm, normal heart sounds and intact distal pulses.   No murmur heard. RRR  Pulmonary/Chest: Effort normal and breath sounds normal. No respiratory distress. He has no wheezes.  Clear and equal breath sounds without wheezing, rales or rhonchi  Abdominal: Soft. Bowel sounds are normal. He exhibits no distension and no mass. There is no tenderness. There is no rebound and no guarding. Hernia confirmed negative in the right inguinal area and confirmed negative in the left inguinal area.  abd soft and nontender  Genitourinary: Testes normal. Rectal exam shows external hemorrhoid. Rectal exam shows no internal hemorrhoid, no fissure, no mass, no tenderness and anal tone normal. Prostate is enlarged. Prostate is not tender. Right testis shows no mass, no swelling and no tenderness. Right testis is descended. Cremasteric reflex is not absent on the right side. Left testis shows no mass, no swelling and no tenderness. Left testis is descended. Cremasteric reflex is not absent on the left side. No phimosis, paraphimosis, hypospadias, penile erythema or penile tenderness. No discharge found.  2 very small thrombosed hemorrhoids at the anal opening without surrounding erythema or induration; no anal fissures noted No tenderness or induration to the perineum or scrotum  No pain to palpation of the scrotum or penis; no penile discharge.    Prostate enlarged, but not tender or boggy and without discrete nodule   Musculoskeletal: Normal range of motion. He exhibits no edema.  Lymphadenopathy:    He has no cervical adenopathy.       Right: No inguinal adenopathy present.       Left: No inguinal adenopathy present.  Neurological: He is alert.  Speech is clear and goal oriented Moves extremities without ataxia  Skin:  Skin is warm and dry. He is not diaphoretic.  Psychiatric: He has a normal mood and affect. His behavior is normal.    ED Course  Procedures (including critical care time) Labs Review Labs Reviewed  URINALYSIS, ROUTINE W REFLEX MICROSCOPIC - Abnormal; Notable for the following:    APPearance HAZY (*)    All other components within normal limits   Imaging Review No results found.   EKG Interpretation None      MDM   Final diagnoses:  Asthma  External hemorrhoids  Enlarged prostate  Medication refill   Frank Patterson presents with complaints of painful rectal hemorrhoids. History and physical consistent with this. He has 2 small external hemorrhoids which are thrombosed but no evidence of cellulitis or necrosis.  He has no pain to the perineum, no induration to the perineum or scrotum.  He is afebrile and only pain on the rectal exam is to be external hemorrhoid. Nontoxic, nonseptic appearing.  No concern for Fournier Gangrene at this time.  No evidence of complication to patient's hemorrhoids. Will refer to general surgery for possible banding or I&D.   Discussed with patient my concerns about his long-standing complaint of  urinary hesitancy and his enlarged prostate. There is no evidence of prostatitis as patient's prostate is nontender and not boggy. UA without evidence of urinary tract infection. No nodules palpated on the patient's prostate.  Less likely BPH or prostate cancer but will refer to urology for further evaluation of this. I discussed this at length with the patient who agrees.    Patient also requests refill of his albuterol inhaler.  I will write a prescription for this her review shows that he's been seen multiple times in the ER for this. Recommended that he followup with [facility] care. I will give him the resources for the wellness clinic.   It has been determined that no acute conditions requiring further emergency intervention are present at this time. The  patient/guardian have been advised of the diagnosis and plan. We have discussed signs and symptoms that warrant return to the ED, such as changes or worsening in symptoms.   Vital signs are stable at discharge.   BP 114/65  Pulse 60  Temp(Src) 97.8 F (36.6 C) (Oral)  Resp 18  SpO2 98%  Patient/guardian has voiced understanding and agreed to follow-up with the PCP or specialist.      Dierdre ForthHannah Conlee Sliter, PA-C 10/15/13 1746

## 2013-10-15 NOTE — Discharge Instructions (Signed)
1. Medications: albuterol for your asthma, anusol for your hemorrhoids, vicodin for pain, usual home medications 2. Treatment: rest, drink plenty of fluids, warm sitz baths,  3. Follow Up: Please followup with the wellness clinic to establish care, urology for further evaluation of your prostate in general surgery for further evaluation of your hemorrhoids.  Return to the emergency department for intractable vomiting, high fevers or worsening symptoms.  Do not drive or operate heavy machinery on your pain medication.  Hemorrhoids Hemorrhoids are swollen veins around the rectum or anus. There are two types of hemorrhoids:   Internal hemorrhoids. These occur in the veins just inside the rectum. They may poke through to the outside and become irritated and painful.  External hemorrhoids. These occur in the veins outside the anus and can be felt as a painful swelling or hard lump near the anus. CAUSES  Pregnancy.   Obesity.   Constipation or diarrhea.   Straining to have a bowel movement.   Sitting for long periods on the toilet.  Heavy lifting or other activity that caused you to strain.  Anal intercourse. SYMPTOMS   Pain.   Anal itching or irritation.   Rectal bleeding.   Fecal leakage.   Anal swelling.   One or more lumps around the anus.  DIAGNOSIS  Your caregiver may be able to diagnose hemorrhoids by visual examination. Other examinations or tests that may be performed include:   Examination of the rectal area with a gloved hand (digital rectal exam).   Examination of anal canal using a small tube (scope).   A blood test if you have lost a significant amount of blood.  A test to look inside the colon (sigmoidoscopy or colonoscopy). TREATMENT Most hemorrhoids can be treated at home. However, if symptoms do not seem to be getting better or if you have a lot of rectal bleeding, your caregiver may perform a procedure to help make the hemorrhoids get smaller  or remove them completely. Possible treatments include:   Placing a rubber band at the base of the hemorrhoid to cut off the circulation (rubber band ligation).   Injecting a chemical to shrink the hemorrhoid (sclerotherapy).   Using a tool to burn the hemorrhoid (infrared light therapy).   Surgically removing the hemorrhoid (hemorrhoidectomy).   Stapling the hemorrhoid to block blood flow to the tissue (hemorrhoid stapling).  HOME CARE INSTRUCTIONS   Eat foods with fiber, such as whole grains, beans, nuts, fruits, and vegetables. Ask your doctor about taking products with added fiber in them (fibersupplements).  Increase fluid intake. Drink enough water and fluids to keep your urine clear or pale yellow.   Exercise regularly.   Go to the bathroom when you have the urge to have a bowel movement. Do not wait.   Avoid straining to have bowel movements.   Keep the anal area dry and clean. Use wet toilet paper or moist towelettes after a bowel movement.   Medicated creams and suppositories may be used or applied as directed.   Only take over-the-counter or prescription medicines as directed by your caregiver.   Take warm sitz baths for 15 20 minutes, 3 4 times a day to ease pain and discomfort.   Place ice packs on the hemorrhoids if they are tender and swollen. Using ice packs between sitz baths may be helpful.   Put ice in a plastic bag.   Place a towel between your skin and the bag.   Leave the ice on for 15  20 minutes, 3 4 times a day.   Do not use a donut-shaped pillow or sit on the toilet for long periods. This increases blood pooling and pain.  SEEK MEDICAL CARE IF:  You have increasing pain and swelling that is not controlled by treatment or medicine.  You have uncontrolled bleeding.  You have difficulty or you are unable to have a bowel movement.  You have pain or inflammation outside the area of the hemorrhoids. MAKE SURE YOU:  Understand  these instructions.  Will watch your condition.  Will get help right away if you are not doing well or get worse. Document Released: 07/31/2000 Document Revised: 07/20/2012 Document Reviewed: 06/07/2012 [facility] Patient Information 2014 [place]Care, [place].

## 2013-10-15 NOTE — ED Notes (Signed)
Hannah PA at bedside assessing.

## 2013-10-15 NOTE — ED Provider Notes (Signed)
Medical screening examination/treatment/procedure(s) were performed by non-physician practitioner and as supervising physician I was immediately available for consultation/collaboration.   EKG Interpretation None       Leo Weyandt R. Augie Vane, MD 10/15/13 2351 

## 2013-10-15 NOTE — ED Notes (Addendum)
Pt reports pain with BM dt hemorrhoids. Pt has 3 hemorrhoids that are thrombosed. Pt also reports urinary retention in the AM x "a long time." Pt denies dysuria or blood in urine or stool.  PT denies any N/V/D at this time.

## 2013-10-15 NOTE — ED Notes (Signed)
Pt c/o painful rectal hemorrhoid. No bleeding but painful. States painful with bowel movement. Has been using preparation H and ibuprofen with no relief. Pt is a x 4. Sitting calmly in chair at traige.

## 2013-12-19 ENCOUNTER — Emergency Department (HOSPITAL_COMMUNITY): Payer: Self-pay

## 2013-12-19 ENCOUNTER — Emergency Department (HOSPITAL_COMMUNITY)
Admission: EM | Admit: 2013-12-19 | Discharge: 2013-12-19 | Disposition: A | Discharge status: Home / Self Care | Payer: Self-pay | Attending: Emergency Medicine | Admitting: Emergency Medicine

## 2013-12-19 DIAGNOSIS — Z79899 Other long term (current) drug therapy: Secondary | ICD-10-CM | POA: Insufficient documentation

## 2013-12-19 DIAGNOSIS — M25549 Pain in joints of unspecified hand: Secondary | ICD-10-CM | POA: Insufficient documentation

## 2013-12-19 DIAGNOSIS — F1010 Alcohol abuse, uncomplicated: Secondary | ICD-10-CM | POA: Insufficient documentation

## 2013-12-19 DIAGNOSIS — R55 Syncope and collapse: Secondary | ICD-10-CM | POA: Insufficient documentation

## 2013-12-19 DIAGNOSIS — S0101XA Laceration without foreign body of scalp, initial encounter: Secondary | ICD-10-CM

## 2013-12-19 DIAGNOSIS — J45909 Unspecified asthma, uncomplicated: Secondary | ICD-10-CM | POA: Insufficient documentation

## 2013-12-19 DIAGNOSIS — L719 Rosacea, unspecified: Secondary | ICD-10-CM | POA: Insufficient documentation

## 2013-12-19 DIAGNOSIS — F17200 Nicotine dependence, unspecified, uncomplicated: Secondary | ICD-10-CM | POA: Insufficient documentation

## 2013-12-19 DIAGNOSIS — Z23 Encounter for immunization: Secondary | ICD-10-CM | POA: Insufficient documentation

## 2013-12-19 DIAGNOSIS — S0100XA Unspecified open wound of scalp, initial encounter: Secondary | ICD-10-CM | POA: Insufficient documentation

## 2013-12-19 MED ORDER — TETANUS-DIPHTH-ACELL PERTUSSIS 5-2.5-18.5 LF-MCG/0.5 IM SUSP
INTRAMUSCULAR | Status: AC
  Filled 2013-12-19: qty 0.5

## 2013-12-19 MED ORDER — TETANUS-DIPHTH-ACELL PERTUSSIS 5-2.5-18.5 LF-MCG/0.5 IM SUSP
0.5000 mL | Freq: Once | INTRAMUSCULAR | Status: AC
  Administered 2013-12-19: 0.5 mL via INTRAMUSCULAR
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
## 2013-12-19 NOTE — ED Notes (Signed)
Pt left, pt allowed EDP to place two staples in his head prior to leaving. Pt left before discharge instructions could be reviewed. EDP informed pt when to follow up with suture removal.

## 2013-12-19 NOTE — ED Notes (Signed)
MD at bedside. 
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
## 2013-12-25 ENCOUNTER — Emergency Department (HOSPITAL_COMMUNITY)
Admission: EM | Admit: 2013-12-25 | Discharge: 2013-12-25 | Disposition: A | Discharge status: Home / Self Care | Payer: Self-pay | Attending: Emergency Medicine | Admitting: Emergency Medicine

## 2013-12-25 ENCOUNTER — Encounter (HOSPITAL_COMMUNITY): Payer: Self-pay | Admitting: Emergency Medicine

## 2013-12-25 DIAGNOSIS — J45909 Unspecified asthma, uncomplicated: Secondary | ICD-10-CM | POA: Insufficient documentation

## 2013-12-25 DIAGNOSIS — Z760 Encounter for issue of repeat prescription: Secondary | ICD-10-CM | POA: Insufficient documentation

## 2013-12-25 DIAGNOSIS — F17200 Nicotine dependence, unspecified, uncomplicated: Secondary | ICD-10-CM | POA: Insufficient documentation

## 2013-12-25 DIAGNOSIS — Z4802 Encounter for removal of sutures: Secondary | ICD-10-CM | POA: Insufficient documentation

## 2013-12-25 DIAGNOSIS — IMO0002 Reserved for concepts with insufficient information to code with codable children: Secondary | ICD-10-CM | POA: Insufficient documentation

## 2013-12-25 MED ORDER — ALBUTEROL SULFATE HFA 108 (90 BASE) MCG/ACT IN AERS: 2.0000 | INHALATION_SPRAY | Freq: Four times a day (QID) | RESPIRATORY_TRACT | Status: DC | PRN

## 2013-12-25 MED ORDER — ALBUTEROL SULFATE HFA 108 (90 BASE) MCG/ACT IN AERS
2.0000 | INHALATION_SPRAY | Freq: Once | RESPIRATORY_TRACT | Status: AC
  Administered 2013-12-25: 2 via RESPIRATORY_TRACT
  Filled 2013-12-25: qty 6.7

## 2013-12-25 NOTE — Discharge Instructions (Signed)
Please follow up with your primary care physician in 1-2 days. If you do not have one please call the [facility] number listed above. Please read all discharge instructions and return precautions.    Suture Removal, Care After Refer to this sheet in the next few weeks. These instructions provide you with information on caring for yourself after your procedure. Your health care provider may also give you more specific instructions. Your treatment has been planned according to current medical practices, but problems sometimes occur. Call your health care provider if you have any problems or questions after your procedure. WHAT TO EXPECT AFTER THE PROCEDURE After your stitches (sutures) are removed, it is typical to have the following:  Some discomfort and swelling in the wound area.  Slight redness in the area. HOME CARE INSTRUCTIONS   If you have skin adhesive strips over the wound area, do not take the strips off. They will fall off on their own in a few days. If the strips remain in place after 14 days, you may remove them.  Change any bandages (dressings) at least once a day or as directed by your health care provider. If the bandage sticks, soak it off with warm, soapy water.  Apply cream or ointment only as directed by your health care provider. If using cream or ointment, wash the area with soap and water 2 times a day to remove all the cream or ointment. Rinse off the soap and pat the area dry with a clean towel.  Keep the wound area dry and clean. If the bandage becomes wet or dirty, or if it develops a bad smell, change it as soon as possible.  Continue to protect the wound from injury.  Use sunscreen when out in the sun. New scars become sunburned easily. SEEK MEDICAL CARE IF:  You have increasing redness, swelling, or pain in the wound.  You see pus coming from the wound.  You have a fever.  You notice a bad smell coming from the wound or  dressing.  Your wound breaks open (edges not staying together). Document Released: 04/28/2001 Document Revised: 05/24/2013 Document Reviewed: 03/15/2013 [facility] Patient Information 2014 [employer], [place].  Medication Refill, Emergency Department We have refilled your medication today as a courtesy to you. It is best for your medical care, however, to take care of getting refills done through your primary caregiver's office. They have your records and can do a better job of follow-up than we can in the emergency department. On maintenance medications, we often only prescribe enough medications to get you by until you are able to see your regular caregiver. This is a more expensive way to refill medications. In the future, please plan for refills so that you will not have to use the emergency department for this. Thank you for your help. Your help allows us to better take care of the daily emergencies that enter our department. Document Released: 11/20/2003 Document Revised: 10/26/2011 Document Reviewed: 08/03/2005 [facility] Patient Information 2014 [place], [place].
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
## 2013-12-25 NOTE — ED Provider Notes (Signed)
Medical screening examination/treatment/procedure(s) were performed by non-physician practitioner and as supervising physician I was immediately available for consultation/collaboration.   EKG Interpretation None        Fowler Antos, MD 12/25/13 1611 

## 2013-12-25 NOTE — ED Notes (Signed)
Patient here to have 2 staples removed from top of head.  Patient denies any problems.

## 2014-03-01 ENCOUNTER — Encounter (HOSPITAL_COMMUNITY): Payer: Self-pay | Admitting: Emergency Medicine

## 2014-03-01 ENCOUNTER — Emergency Department (HOSPITAL_COMMUNITY)
Admission: EM | Admit: 2014-03-01 | Discharge: 2014-03-01 | Disposition: A | Discharge status: Home / Self Care | Payer: Self-pay | Attending: Emergency Medicine | Admitting: Emergency Medicine

## 2014-03-01 DIAGNOSIS — F17200 Nicotine dependence, unspecified, uncomplicated: Secondary | ICD-10-CM | POA: Insufficient documentation

## 2014-03-01 DIAGNOSIS — Z760 Encounter for issue of repeat prescription: Secondary | ICD-10-CM | POA: Insufficient documentation

## 2014-03-01 DIAGNOSIS — Z872 Personal history of diseases of the skin and subcutaneous tissue: Secondary | ICD-10-CM | POA: Insufficient documentation

## 2014-03-01 DIAGNOSIS — J45909 Unspecified asthma, uncomplicated: Secondary | ICD-10-CM | POA: Insufficient documentation

## 2014-03-01 MED ORDER — ALBUTEROL SULFATE HFA 108 (90 BASE) MCG/ACT IN AERS
2.0000 | INHALATION_SPRAY | RESPIRATORY_TRACT | Status: DC | PRN
  Filled 2014-03-01: qty 6.7

## 2014-03-01 MED ORDER — ALBUTEROL SULFATE HFA 108 (90 BASE) MCG/ACT IN AERS: 1.0000 | INHALATION_SPRAY | Freq: Four times a day (QID) | RESPIRATORY_TRACT | Status: DC | PRN

## 2014-03-01 NOTE — ED Provider Notes (Signed)
CSN: [id number]     Arrival date & time 03/01/14  1603 History   This chart was scribed for Charlestine Nighthristopher Yonis Carreon, PA-C working with  Shon Batonourtney F Horton, MD by Evon Slackerrance Branch, ED Scribe. This patient was seen in room TR11C/TR11C and the patient's care was started at 4:33 PM.     Chief Complaint  Patient presents with  . Medication Refill    The history is provided by the patient. No language interpreter was used.   HPI Comments: Frank Patterson is a 28 y.o. male who presents to the Emergency Department for medication refill. He states he doesn't have a PCP so he comes for the ED to get his albuterol inhaler refilled. He states that he has a history of asthma.   Past Medical History  Diagnosis Date  . Asthma   . Rosacea    History reviewed. No pertinent past surgical history. No family history on file. History  Substance Use Topics  . Smoking status: Current Every Day Smoker -- 0.50 packs/day    Types: Cigarettes  . Smokeless tobacco: Not on file  . Alcohol Use: Yes    Review of Systems A complete 10 system review of systems was obtained and all systems are negative except as noted in the HPI and PMH.    Allergies  Review of patient's allergies indicates no known allergies.  Home Medications   Prior to Admission medications   Medication Sig Start Date End Date Taking? Authorizing Provider  albuterol (PROVENTIL HFA;VENTOLIN HFA) 108 (90 BASE) MCG/ACT inhaler Inhale 2 puffs into the lungs every 4 (four) hours as needed for wheezing or shortness of breath. 06/29/13   Fayrene HelperBowie Tran, PA-C  albuterol (PROVENTIL HFA;VENTOLIN HFA) 108 (90 BASE) MCG/ACT inhaler Inhale 2 puffs into the lungs every 6 (six) hours as needed for wheezing or shortness of breath. 12/25/13   Jennifer L Piepenbrink, PA-C   BP 113/62  Pulse 60  Temp(Src) 98.1 F (36.7 C) (Oral)  Resp 20  Ht 6' (1.829 m)  Wt 160 lb (72.576 kg)  BMI 21.70 kg/m2  SpO2 98%  Physical Exam  Nursing note and vitals  reviewed. Constitutional: He is oriented to person, place, and time. He appears well-developed and well-nourished. No distress.  HENT:  Head: Normocephalic and atraumatic.  Mouth/Throat: Oropharynx is clear and moist.  Eyes: Pupils are equal, round, and reactive to light.  Neck: Normal range of motion. Neck supple.  Cardiovascular: Normal rate, regular rhythm and normal heart sounds.   Pulmonary/Chest: Effort normal and breath sounds normal. No respiratory distress.  Musculoskeletal: Normal range of motion. He exhibits no edema.  Neurological: He is alert and oriented to person, place, and time.  Skin: Skin is warm and dry.  Psychiatric: He has a normal mood and affect. His behavior is normal.    ED Course  Procedures (including critical care time) DIAGNOSTIC STUDIES: Oxygen Saturation is 98% on RA, normal by my interpretation.    COORDINATION OF CARE:    Refill of his inhaler.  Carlyle Dollyhristopher W Yue Flanigan, PA-C 03/02/14 0205

## 2014-03-01 NOTE — Discharge Instructions (Signed)
Return here as needed. [facility].  °

## 2014-03-01 NOTE — ED Notes (Signed)
Pt reports he ran out of inhaler this morning and needs a new one until he can get a prescription filled. Pt sts he uses his inhaler daily.

## 2014-03-01 NOTE — ED Notes (Signed)
Pt given inhaler to take home.

## 2014-03-02 NOTE — ED Provider Notes (Signed)
Medical screening examination/treatment/procedure(s) were performed by non-physician practitioner and as supervising physician I was immediately available for consultation/collaboration.   EKG Interpretation None        Shon Batonourtney F Horton, MD 03/02/14 [phone]

## 2015-06-18 ENCOUNTER — Encounter (HOSPITAL_COMMUNITY): Payer: Self-pay

## 2015-06-18 ENCOUNTER — Emergency Department (HOSPITAL_COMMUNITY)
Admission: EM | Admit: 2015-06-18 | Discharge: 2015-06-18 | Disposition: A | Discharge status: Home / Self Care | Payer: Self-pay | Attending: Emergency Medicine | Admitting: Emergency Medicine

## 2015-06-18 ENCOUNTER — Inpatient Hospital Stay (HOSPITAL_COMMUNITY)
Admission: EM | Admit: 2015-06-18 | Discharge: 2015-06-19 | DRG: 897 | Disposition: A | Discharge status: Home / Self Care | Payer: Federal, State, Local not specified - Other | Source: Intra-hospital | Attending: Psychiatry | Admitting: Psychiatry

## 2015-06-18 DIAGNOSIS — F1594 Other stimulant use, unspecified with stimulant-induced mood disorder: Secondary | ICD-10-CM | POA: Diagnosis not present

## 2015-06-18 DIAGNOSIS — F410 Panic disorder [episodic paroxysmal anxiety] without agoraphobia: Secondary | ICD-10-CM | POA: Diagnosis present

## 2015-06-18 DIAGNOSIS — F1990 Other psychoactive substance use, unspecified, uncomplicated: Secondary | ICD-10-CM | POA: Diagnosis not present

## 2015-06-18 DIAGNOSIS — F1994 Other psychoactive substance use, unspecified with psychoactive substance-induced mood disorder: Secondary | ICD-10-CM

## 2015-06-18 DIAGNOSIS — F332 Major depressive disorder, recurrent severe without psychotic features: Secondary | ICD-10-CM | POA: Diagnosis present

## 2015-06-18 DIAGNOSIS — X781XXA Intentional self-harm by knife, initial encounter: Secondary | ICD-10-CM | POA: Insufficient documentation

## 2015-06-18 DIAGNOSIS — F1210 Cannabis abuse, uncomplicated: Secondary | ICD-10-CM | POA: Diagnosis present

## 2015-06-18 DIAGNOSIS — S61512A Laceration without foreign body of left wrist, initial encounter: Secondary | ICD-10-CM | POA: Insufficient documentation

## 2015-06-18 DIAGNOSIS — Z046 Encounter for general psychiatric examination, requested by authority: Secondary | ICD-10-CM | POA: Insufficient documentation

## 2015-06-18 DIAGNOSIS — Y9389 Activity, other specified: Secondary | ICD-10-CM | POA: Insufficient documentation

## 2015-06-18 DIAGNOSIS — Y998 Other external cause status: Secondary | ICD-10-CM | POA: Insufficient documentation

## 2015-06-18 DIAGNOSIS — F909 Attention-deficit hyperactivity disorder, unspecified type: Secondary | ICD-10-CM | POA: Diagnosis present

## 2015-06-18 DIAGNOSIS — Z79899 Other long term (current) drug therapy: Secondary | ICD-10-CM | POA: Insufficient documentation

## 2015-06-18 DIAGNOSIS — F1910 Other psychoactive substance abuse, uncomplicated: Secondary | ICD-10-CM | POA: Diagnosis present

## 2015-06-18 DIAGNOSIS — F1514 Other stimulant abuse with stimulant-induced mood disorder: Secondary | ICD-10-CM | POA: Diagnosis present

## 2015-06-18 DIAGNOSIS — F17200 Nicotine dependence, unspecified, uncomplicated: Secondary | ICD-10-CM | POA: Diagnosis present

## 2015-06-18 DIAGNOSIS — F1510 Other stimulant abuse, uncomplicated: Secondary | ICD-10-CM | POA: Insufficient documentation

## 2015-06-18 DIAGNOSIS — Z720 Tobacco use: Secondary | ICD-10-CM | POA: Insufficient documentation

## 2015-06-18 DIAGNOSIS — R45851 Suicidal ideations: Secondary | ICD-10-CM

## 2015-06-18 DIAGNOSIS — F0630 Mood disorder due to known physiological condition, unspecified: Secondary | ICD-10-CM

## 2015-06-18 DIAGNOSIS — Y9289 Other specified places as the place of occurrence of the external cause: Secondary | ICD-10-CM | POA: Insufficient documentation

## 2015-06-18 HISTORY — DX: Other psychoactive substance abuse, uncomplicated: F19.10

## 2015-06-18 HISTORY — DX: Suicidal ideations: R45.851

## 2015-06-18 LAB — COMPREHENSIVE METABOLIC PANEL
ALT: 44 U/L (ref 17–63)
AST: 31 U/L (ref 15–41)
Albumin: 3.8 g/dL (ref 3.5–5.0)
Alkaline Phosphatase: 55 U/L (ref 38–126)
Anion gap: 5 (ref 5–15)
BUN: 11 mg/dL (ref 6–20)
CO2: 26 mmol/L (ref 22–32)
Calcium: 9.2 mg/dL (ref 8.9–10.3)
Chloride: 108 mmol/L (ref 101–111)
Creatinine, Ser: 0.85 mg/dL (ref 0.61–1.24)
GFR calc Af Amer: >60 mL/min (ref >60)
GFR calc non Af Amer: >60 mL/min (ref >60)
Glucose, Bld: 92 mg/dL (ref 65–99)
Potassium: 3.9 mmol/L (ref 3.5–5.1)
Sodium: 139 mmol/L (ref 135–145)
Total Bilirubin: 0.6 mg/dL (ref 0.3–1.2)
Total Protein: 6.7 g/dL (ref 6.5–8.1)

## 2015-06-18 LAB — URINALYSIS, ROUTINE W REFLEX MICROSCOPIC
Bilirubin Urine: NEGATIVE
Glucose, UA: NEGATIVE mg/dL
Ketones, ur: NEGATIVE mg/dL
Leukocytes, UA: NEGATIVE
NITRITE: NEGATIVE
Protein, ur: NEGATIVE mg/dL
SPECIFIC GRAVITY, URINE: 1.024 (ref 1.005–1.030)
UROBILINOGEN UA: 1 mg/dL (ref 0.0–1.0)
pH: 7 (ref 5.0–8.0)

## 2015-06-18 LAB — CBC WITH DIFFERENTIAL/PLATELET
Basophils Absolute: 0 10*3/uL (ref 0.0–0.1)
Basophils Relative: 0 %
Eosinophils Absolute: 0.3 10*3/uL (ref 0.0–0.7)
Eosinophils Relative: 4 %
HCT: 42.4 % (ref 39.0–52.0)
Hemoglobin: 14.5 g/dL (ref 13.0–17.0)
Lymphocytes Relative: 22 %
Lymphs Abs: 1.6 10*3/uL (ref 0.7–4.0)
MCH: 33 pg (ref 26.0–34.0)
MCHC: 34.2 g/dL (ref 30.0–36.0)
MCV: 96.4 fL (ref 78.0–100.0)
Monocytes Absolute: 0.5 10*3/uL (ref 0.1–1.0)
Monocytes Relative: 7 %
Neutro Abs: 4.7 10*3/uL (ref 1.7–7.7)
Neutrophils Relative %: 67 %
Platelets: 239 10*3/uL (ref 150–400)
RBC: 4.4 MIL/uL (ref 4.22–5.81)
RDW: 12.6 % (ref 11.5–15.5)
WBC: 7.1 10*3/uL (ref 4.0–10.5)

## 2015-06-18 LAB — RAPID URINE DRUG SCREEN, HOSP PERFORMED
AMPHETAMINES: POSITIVE — AB
Barbiturates: NOT DETECTED
Benzodiazepines: NOT DETECTED
COCAINE: NOT DETECTED
OPIATES: NOT DETECTED
Tetrahydrocannabinol: POSITIVE — AB

## 2015-06-18 LAB — URINE MICROSCOPIC-ADD ON

## 2015-06-18 LAB — ETHANOL: Alcohol, Ethyl (B): <5 mg/dL (ref <5)

## 2015-06-18 MED ORDER — ALBUTEROL SULFATE (2.5 MG/3ML) 0.083% IN NEBU
5.0000 mg | INHALATION_SOLUTION | Freq: Once | RESPIRATORY_TRACT | Status: AC
  Administered 2015-06-18: 5 mg via RESPIRATORY_TRACT
  Filled 2015-06-18: qty 6

## 2015-06-18 MED ORDER — LORAZEPAM 1 MG PO TABS: 1.0000 mg | ORAL_TABLET | Freq: Three times a day (TID) | ORAL | Status: DC | PRN

## 2015-06-18 MED ORDER — IPRATROPIUM BROMIDE 0.02 % IN SOLN
0.5000 mg | Freq: Once | RESPIRATORY_TRACT | Status: AC
  Administered 2015-06-18: 0.5 mg via RESPIRATORY_TRACT
  Filled 2015-06-18: qty 2.5

## 2015-06-18 MED ORDER — ALUM & MAG HYDROXIDE-SIMETH 200-200-20 MG/5ML PO SUSP: 30.0000 mL | ORAL | Status: DC | PRN

## 2015-06-18 MED ORDER — NICOTINE 21 MG/24HR TD PT24: 21.0000 mg | MEDICATED_PATCH | Freq: Every day | TRANSDERMAL | Status: DC | PRN

## 2015-06-18 MED ORDER — TRAZODONE HCL 50 MG PO TABS
50.0000 mg | ORAL_TABLET | Freq: Every evening | ORAL | Status: DC | PRN
  Administered 2015-06-18: 50 mg via ORAL
  Filled 2015-06-18: qty 1

## 2015-06-18 MED ORDER — TETANUS-DIPHTH-ACELL PERTUSSIS 5-2.5-18.5 LF-MCG/0.5 IM SUSP
0.5000 mL | Freq: Once | INTRAMUSCULAR | Status: AC
  Administered 2015-06-18: 0.5 mL via INTRAMUSCULAR
  Filled 2015-06-18: qty 0.5

## 2015-06-18 MED ORDER — MAGNESIUM HYDROXIDE 400 MG/5ML PO SUSP: 30.0000 mL | Freq: Every day | ORAL | Status: DC | PRN

## 2015-06-18 MED ORDER — ACETAMINOPHEN 325 MG PO TABS: 650.0000 mg | ORAL_TABLET | ORAL | Status: DC | PRN

## 2015-06-18 MED ORDER — BACITRACIN ZINC 500 UNIT/GM EX OINT
1.0000 "application " | TOPICAL_OINTMENT | Freq: Two times a day (BID) | CUTANEOUS | Status: DC
  Administered 2015-06-18: 1 via TOPICAL
  Filled 2015-06-18: qty 1.8

## 2015-06-18 MED ORDER — PREDNISONE 20 MG PO TABS
60.0000 mg | ORAL_TABLET | Freq: Once | ORAL | Status: AC
  Administered 2015-06-18: 60 mg via ORAL
  Filled 2015-06-18: qty 3

## 2015-06-18 MED ORDER — IBUPROFEN 200 MG PO TABS: 600.0000 mg | ORAL_TABLET | Freq: Three times a day (TID) | ORAL | Status: DC | PRN

## 2015-06-18 MED ORDER — NICOTINE 21 MG/24HR TD PT24
21.0000 mg | MEDICATED_PATCH | Freq: Every day | TRANSDERMAL | Status: DC
  Administered 2015-06-19: 21 mg via TRANSDERMAL
  Filled 2015-06-18 (×3): qty 1

## 2015-06-18 MED ORDER — LIDOCAINE-EPINEPHRINE (PF) 2 %-1:200000 IJ SOLN
20.0000 mL | Freq: Once | INTRAMUSCULAR | Status: AC
  Administered 2015-06-18: 20 mL via INTRADERMAL
  Filled 2015-06-18: qty 20

## 2015-06-18 MED ORDER — LORAZEPAM 0.5 MG PO TABS
0.5000 mg | ORAL_TABLET | Freq: Four times a day (QID) | ORAL | Status: DC | PRN
  Administered 2015-06-18: 0.5 mg via ORAL
  Filled 2015-06-18: qty 1

## 2015-06-18 MED ORDER — ONDANSETRON HCL 4 MG PO TABS
4.0000 mg | ORAL_TABLET | Freq: Three times a day (TID) | ORAL | Status: DC | PRN
Start: 2015-06-18 — End: 2015-06-18

## 2015-06-18 MED ORDER — BACITRACIN ZINC 500 UNIT/GM EX OINT
TOPICAL_OINTMENT | CUTANEOUS | Status: AC
  Administered 2015-06-18: 1 via TOPICAL
  Filled 2015-06-18: qty 0.9

## 2015-06-18 MED ORDER — LORATADINE 10 MG PO TABS
10.0000 mg | ORAL_TABLET | Freq: Every day | ORAL | Status: DC
  Administered 2015-06-18: 10 mg via ORAL
  Filled 2015-06-18: qty 1

## 2015-06-18 MED ORDER — ALBUTEROL SULFATE HFA 108 (90 BASE) MCG/ACT IN AERS
2.0000 | INHALATION_SPRAY | RESPIRATORY_TRACT | Status: DC | PRN
  Administered 2015-06-18: 2 via RESPIRATORY_TRACT
  Filled 2015-06-18: qty 6.7

## 2015-06-18 MED ORDER — PREDNISONE 20 MG PO TABS: 40.0000 mg | ORAL_TABLET | Freq: Every day | ORAL | Status: DC
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
## 2015-06-18 NOTE — ED Provider Notes (Signed)
CSN: [id number]     Arrival date & time 06/18/15  1056 History   First MD Initiated Contact with Patient 06/18/15 1107     Chief Complaint  Patient presents with  . Medical Clearance  . Suicidal  . Extremity Laceration     (Consider location/radiation/quality/duration/timing/severity/associated sxs/prior Treatment) HPI  Patient is a 29 year old male with history of polysubstance abuse who called EMS today because of suicidal ideations and a self-inflicted knife laceration to his left wrist. He states that he called EMS because he was scared. He endorses a history of depression and fell attempts at getting clean from cocaine, Adderall, marijuana and alcohol.  He denies any heroin, prescription narcotic use, or ingestion of over-the-counter medications. He states that he has been feeling suicidal for the past week, and does not have a plan before he cut himself today. He denies any homicidal ideation or plan. He also denies any auditory or visual hallucinations. He states that as recently as 3 days ago he used multiple substances. He has a medical history of asthma and last used his rescue inhaler last night. He has not managed on any other medication, although he states that he does have seasonal allergy component which worsens his asthma however he states he cannot afford any over-the-counter antiallergy medicines at this time.   Patient does not know his last tetanus booster.  Past Medical History  Diagnosis Date  . Polysubstance abuse   . Suicidal thoughts    History reviewed. No pertinent past surgical history. No family history on file. Social History  Substance Use Topics  . Smoking status: Current Every Day Smoker  . Smokeless tobacco: None  . Alcohol Use: Yes    Review of Systems  Constitutional: Negative.   HENT: Negative.   Eyes: Negative.   Respiratory: Negative.  Negative for cough, chest tightness and shortness of breath.   Cardiovascular: Negative.  Negative for chest  pain, palpitations and leg swelling.  Gastrointestinal: Negative.  Negative for nausea, vomiting, abdominal pain and diarrhea.  Genitourinary: Negative.   Musculoskeletal: Negative.   Skin: Positive for wound. Negative for color change, pallor and rash.  Neurological: Negative.   Psychiatric/Behavioral: Positive for suicidal ideas, self-injury and dysphoric mood. Negative for hallucinations, behavioral problems, confusion, sleep disturbance, decreased concentration and agitation. The patient is not nervous/anxious and is not hyperactive.       Allergies  Review of patient's allergies indicates no known allergies.  Home Medications   Prior to Admission medications   Medication Sig Start Date End Date Taking? Authorizing Provider  albuterol (PROVENTIL HFA;VENTOLIN HFA) 108 (90 BASE) MCG/ACT inhaler Inhale 2 puffs into the lungs every 4 (four) hours as needed for wheezing or shortness of breath.   Yes Historical Provider, MD   BP 107/67 mmHg  Pulse 78  Temp(Src) 98 F (36.7 C) (Oral)  Resp 16  SpO2 100% Physical Exam  Constitutional: He is oriented to person, place, and time. He appears well-developed and well-nourished. No distress.  HENT:  Head: Normocephalic and atraumatic.  Nose: Nose normal.  Mouth/Throat: Oropharynx is clear and moist. No oropharyngeal exudate.  Eyes: Conjunctivae and EOM are normal. Pupils are equal, round, and reactive to light. Right eye exhibits no discharge. Left eye exhibits no discharge. No scleral icterus.  Neck: Normal range of motion. No JVD present. No tracheal deviation present. No thyromegaly present.  Cardiovascular: Normal rate, regular rhythm, normal heart sounds and intact distal pulses.  Exam reveals no gallop and no friction rub.  No murmur heard. Pulmonary/Chest: Effort normal. No respiratory distress. He has wheezes. He has no rales. He exhibits no tenderness.  Abdominal: Soft. Bowel sounds are normal. He exhibits no distension and no  mass. There is no tenderness. There is no rebound and no guarding.  Musculoskeletal: Normal range of motion. He exhibits no edema or tenderness.  Lymphadenopathy:    He has no cervical adenopathy.  Neurological: He is alert and oriented to person, place, and time. He has normal reflexes. No cranial nerve deficit. He exhibits normal muscle tone. Coordination normal.  Skin: Skin is warm. No rash noted. He is not diaphoretic. No erythema. No pallor.  A superficial 1 inch laceration to his left medial wrist, with presence of dried blood but bleeding controlled. Laceration gaping approximately 1-2 mm  Psychiatric: His speech is normal. His affect is blunt. He is slowed and withdrawn. Thought content is not paranoid. Cognition and memory are normal. He expresses impulsivity. He does not express inappropriate judgment. He exhibits a depressed mood. He expresses suicidal ideation. He expresses no homicidal ideation.  Nursing note and vitals reviewed.      ED Course  Procedures (including critical care time) Labs Review Labs Reviewed  URINE RAPID DRUG SCREEN, HOSP PERFORMED - Abnormal; Notable for the following:    Amphetamines POSITIVE (*)    Tetrahydrocannabinol POSITIVE (*)    All other components within normal limits  URINALYSIS, ROUTINE W REFLEX MICROSCOPIC (NOT AT [facility]) - Abnormal; Notable for the following:    APPearance CLOUDY (*)    Hgb urine dipstick SMALL (*)    All other components within normal limits  URINE MICROSCOPIC-ADD ON - Abnormal; Notable for the following:    Crystals CA OXALATE CRYSTALS (*)    All other components within normal limits  COMPREHENSIVE METABOLIC PANEL  ETHANOL  CBC WITH DIFFERENTIAL/PLATELET   LACERATION REPAIR Performed by: Danelle Berry Consent: Verbal consent obtained. Risks and benefits: risks, benefits and alternatives were discussed Patient identity confirmed: provided demographic data Time out performed prior to procedure Prepped and Draped in  normal sterile fashion Wound explored Laceration Location: left wrist Laceration Length: 7 cm No Foreign Bodies seen or palpated Anesthesia: local infiltration Local anesthetic: lidocaine 2% with epinephrine Anesthetic total: 3 ml Irrigation method: syringe Amount of cleaning: standard Skin closure: 5.0 prolene Number of sutures: 4 Technique: simple interupted Patient tolerance: Patient tolerated the procedure well with no immediate complications.   Imaging Review No results found. I have personally reviewed and evaluated these images and lab results as part of my medical decision-making.   EKG Interpretation None      MDM   Final diagnoses:  Suicidal ideations  Laceration of wrist without complication, left, initial encounter    Patient with suicidal ideations and self-inflicted left wrist laceration.   He admits to multiple substance abuse as of 3 days ago, endorses a history of depression, polysubstance abuse, and currently endorses suicidal ideation, currently denies a plan, denies HI, AVH.  Patient has a history of asthma, he had diffuse expiratory wheeze which was cleared after one dose of prednisone and 1 breathing treatment. He is not currently having acute exacerbation, no no respiratory distress.  Laceration on his left wrist was superficial approximately 7 cm long. The area that needed to repaired was approx 2 cm long and was repaired with simple interrupted sutures, the remaining length was superficial.  After repair, wound was dressed with antibiotic gauze.  Pt is medically clear at this time, and has been moved  to psych hold.  Medicines for asthma have been ordered.  Disposition according to TTS.    Danelle Berry, PA-C 06/29/15 9563  Leta Baptist, MD 06/30/15 2150

## 2015-06-18 NOTE — ED Notes (Signed)
SUTURE CART AT BS 

## 2015-06-18 NOTE — ED Notes (Signed)
Pt. Unable to urinate at this time. Will collect when pt. Voids. Nurse aware.

## 2015-06-18 NOTE — ED Notes (Signed)
EDPA TARA at bedside.

## 2015-06-18 NOTE — BH Assessment (Signed)
[facility] Fransisca KaufmannLaura Davis, NP. The room assignment is 307-2. The attending provider is Dr. Geoffery LyonsIrving Lugo. Nursing report [phone]. Support paperwork completed. [facility]. Patient's nurse updated.

## 2015-06-18 NOTE — Progress Notes (Signed)
Patient discharge note:  [facility]  [facility] via [facility] transportation.  Patient signed for personal belongings and admission discussed.  Patient is calm and cooperative.

## 2015-06-18 NOTE — ED Notes (Signed)
Bed: WA09 Expected date:  Expected time:  Means of arrival:  Comments: suicidal

## 2015-06-18 NOTE — Progress Notes (Signed)
[facility] Group Notes:  (Nursing/MHT/Case Management/Adjunct)  Date:  06/18/2015  Time:  9:16 PM  Type of Therapy:  Psychoeducational Skills  Participation Level:  Active  Participation Quality:  Appropriate, Attentive, Sharing and Supportive  Affect:  Appropriate  Cognitive:  Appropriate  Insight:  Appropriate  Engagement in Group:  Engaged  Modes of Intervention:  Discussion  Summary of Progress/Problems: Pt attended group this evening and was appropriate. Pt rated his day a 5/10. Pt stated his goal is to leave tomorrow--pt stated he will try to accomplish this by "telling them what they want to hear."   Caswell CorwinOwen, Allysia Ingles C 06/18/2015, 9:16 PM

## 2015-06-18 NOTE — ED Notes (Signed)
Per [facility]- Prior to their arrival, Pt attempted SI by self inflicting knife laceration. (1/2 inch anterior left wrist). 4x4 sterile applied and bleeding controlled. Denies HI. HX of substance abuse denies any use for 3 months

## 2015-06-18 NOTE — ED Notes (Signed)
Belonging bag x 1 with pt and [facility]

## 2015-06-18 NOTE — Progress Notes (Signed)
Pt confirms he is [place] in [place]   Pt given a list of [place] providers to include [facility], [facility], [facility], [facility], [facility], [facility], [facility]

## 2015-06-18 NOTE — ED Notes (Signed)
[facility]  Report given to Drenda FreezeBeverly K., RN.  Patient pacing the hallway.  Patient states he only relapsed on meth and adderall "one time."  Denies SI stating the cut on his left forearm was an impulsive act due to his relapse.  He states he called the hospital.  Patient appears fidgety and restless.  Made several calls on the telephone and started getting upset so staff cut the phone off.  Patient can be labile, however, will come back and apologize.  He is cooperative and directable.

## 2015-06-19 ENCOUNTER — Encounter (HOSPITAL_COMMUNITY): Payer: Self-pay | Admitting: Psychiatry

## 2015-06-19 DIAGNOSIS — F1990 Other psychoactive substance use, unspecified, uncomplicated: Secondary | ICD-10-CM

## 2015-06-19 DIAGNOSIS — F1594 Other stimulant use, unspecified with stimulant-induced mood disorder: Secondary | ICD-10-CM

## 2015-06-19 DIAGNOSIS — F0630 Mood disorder due to known physiological condition, unspecified: Secondary | ICD-10-CM

## 2015-06-19 DIAGNOSIS — F1994 Other psychoactive substance use, unspecified with psychoactive substance-induced mood disorder: Secondary | ICD-10-CM

## 2015-06-19 MED ORDER — TRAZODONE HCL 50 MG PO TABS: 50.0000 mg | ORAL_TABLET | Freq: Every evening | ORAL | Status: AC | PRN

## 2015-06-19 MED ORDER — PNEUMOCOCCAL VAC POLYVALENT 25 MCG/0.5ML IJ INJ: 0.5000 mL | INJECTION | INTRAMUSCULAR | Status: DC

## 2015-06-19 MED ORDER — NICOTINE 21 MG/24HR TD PT24: 21.0000 mg | MEDICATED_PATCH | Freq: Every day | TRANSDERMAL | Status: AC

## 2015-06-19 MED ORDER — INFLUENZA VAC SPLIT QUAD 0.5 ML IM SUSY
0.5000 mL | PREFILLED_SYRINGE | INTRAMUSCULAR | Status: DC
  Filled 2015-06-19: qty 0.5

## 2015-06-19 NOTE — Tx Team (Signed)
Initial Interdisciplinary Treatment Plan   PATIENT STRESSORS: Financial difficulties Legal issue Substance abuse   PATIENT STRENGTHS: Average or above average intelligence General fund of knowledge Motivation for treatment/growth Physical Health Supportive family/friends   PROBLEM LIST: Problem List/Patient Goals Date to be addressed Date deferred Reason deferred Estimated date of resolution  Substance abuse "relapse after 3 months of being clean" 11/1   D/c  Increased risk for SI 11/1     Anxiety                                           DISCHARGE CRITERIA:  Improved stabilization in mood, thinking, and/or behavior Motivation to continue treatment in a less acute level of care Need for constant or close observation no longer present Verbal commitment to aftercare and medication compliance  PRELIMINARY DISCHARGE PLAN: Attend 12-step recovery group Outpatient therapy  PATIENT/FAMIILY INVOLVEMENT: This treatment plan has been presented to and reviewed with the patient, [facility].  The patient and family have been given the opportunity to ask questions and make suggestions.  Maijor Hornig A 06/19/2015, 7:40 AM

## 2015-06-19 NOTE — BH Assessment (Signed)
29 yr old male vol admitted for polysub abuse and SI. Pt reports that he cut his left wrist after snorting methamphetamine. Pt reports that he "sober-up" and called EMS once he saw the amount of blood present. Pt reports that he is unable to recall exactly what happened. Pt admits to no drug use for the past 3 months. Pt identifies substance abuse as his primarily stressor. Pt states that cocaine is his drug of choice. Pt verbalizes that he is not interested in inpatient rehab at this time. Pt does verbalize the need for medication assistance upon being discharged.  Pt oriented to the unit's polices and procedures. Pt remains safe at this time.

## 2015-06-19 NOTE — Progress Notes (Signed)
  [facility] Discharge Plan :  Will you be returning to the same living situation after discharge:  Yes,  patient plans to return home At discharge, do you have transportation home?: Yes,  patient reports access to transportation by friends/family Do you have the ability to pay for your medications: Yes,  patient will be provided with prescriptions at discharge  Release of information consent forms completed and in the chart;  Patient's signature needed at discharge.  Patient to Follow up at: Follow-up Information    Follow up with Patient declines referral follow up services at this time. .      Patient denies SI/HI: Yes,  denies    Aeronautical engineerafety Planning and Suicide Prevention discussed: Yes,  with patient  Have you used any form of tobacco in the last 30 days? (Cigarettes, Smokeless Tobacco, Cigars, and/or Pipes): Yes  Has patient been referred to the Quitline?: Patient refused referral  [facility], [facility] 06/19/2015, 1:06 PM

## 2015-06-19 NOTE — Tx Team (Signed)
Interdisciplinary Treatment Plan Update (Adult) Date: 06/19/2015    Time Reviewed: 9:30 AM  Progress in Treatment: Attending groups: Continuing to assess, patient new to milieu Participating in groups: Continuing to assess, patient new to milieu Taking medication as prescribed: Yes Tolerating medication: Yes Family/Significant other contact made: No, CSW assessing for appropriate contacts Patient understands diagnosis: Yes Discussing patient identified problems/goals with staff: Yes Medical problems stabilized or resolved: Yes Denies suicidal/homicidal ideation: Yes Issues/concerns per patient self-inventory: Yes Other:  New problem(s) identified: N/A  Discharge Plan or Barriers: Patient plans to return home to follow up with outpatient services.   Reason for Continuation of Hospitalization:  Depression Anxiety Medication Stabilization   Comments: N/A  Estimated length of stay: Discharge anticipated for today 06/19/15.    Patient is a 29 year old male admitted for cutting his wrist while under the influence. Patient lives in [place]. And plans to return at discharge. Patient will benefit from crisis stabilization, medication evaluation, group therapy, and psycho education in addition to case management for discharge planning. Patient and CSW reviewed pt's identified goals and treatment plan. Pt verbalized understanding and agreed to treatment plan.     Review of initial/current patient goals per problem list:  1. Goal(s): Patient will participate in aftercare plan   Met: Yes   Target date: 3-5 days post admission date   As evidenced by: Patient will participate within aftercare plan AEB aftercare provider and housing plan at discharge being identified.  11/2: Goal met: Patient plans to return home to follow up with current outpatient provider.     2. Goal (s): Patient will exhibit decreased depressive symptoms and suicidal ideations.   Met: Yes   Target  date: 3-5 days post admission date   As evidenced by: Patient will utilize self rating of depression at 3 or below and demonstrate decreased signs of depression or be deemed stable for discharge by MD.  11/2: Goal met: Patient rates depression at 0 today, denies SI.     3. Goal(s): Patient will demonstrate decreased signs and symptoms of anxiety.   Met: Adequate for discharge per MD   Target date: 3-5 days post admission date   As evidenced by: Patient will utilize self rating of anxiety at 3 or below and demonstrated decreased signs of anxiety, or be deemed stable for discharge by MD  11/2: Adequate for discharge per MD. Patient rates anxiety at 5 today but reports feeling safe to discharge.      4. Goal(s): Patient will demonstrate decreased signs of withdrawal due to substance abuse   Met: Yes   Target date: 3-5 days post admission date   As evidenced by: Patient will produce a CIWA/COWS score of 0, have stable vitals signs, and no symptoms of withdrawal  11/2: Goal met: No withdrawal symptoms reported at this time per medical chart.    Attendees: Patient:    Family:    Physician: Dr. Parke Poisson; Dr. Sabra Heck 06/19/2015 9:30 AM  Nursing:  Darrol Angel, Salvatore Decent ,RN 06/19/2015 9:30 AM  Clinical Social Worker: Tilden Fossa,  [place] 06/19/2015 9:30 AM  Other: Peri Maris, Heather Smart LCSWA  06/19/2015 9:30 AM  Other:  06/19/2015 9:30 AM  Other: Lars Pinks, Case Manager 06/19/2015 9:30 AM  Other:  Nino Parsley, NP 06/19/2015 9:30 AM  Other:    Other:    Other:    Other:      Scribe for Treatment Team:  Tilden Fossa, MSW, [place] [phone]

## 2015-06-19 NOTE — H&P (Signed)
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 Polysubstance abuse [F19.10] 06/18/2015   History of Present Illness:: 29 Y/O male who states he tried meth once last week for the first time. He snorted it.. States he was feeling paranoid. Went to [facility] was given IV fluids. Monday night states he went to a friends house and it was offered to him ( Methamphetamine, Amphetamines). States he was having a hard time after aving seen his ex GF who was doing really bad on heroin.  . Initially he states he did not see much going on but later that night he started feeling the full effect of the drug. Got paranoid. Felt disgusted with himself he cut his wrist. He dozed off and when he woke up he realized what he has done and called 911. States he was diagnosed ADHD as a child he was and still is " high strung". Then when he went to [facility] in 2006 he was diagnosed with Bipolar Disordes States he has episodes of increased anxiety. States that he is a very paranoid kind of guy. Left home when he was 16. He has been on and off prison several times. Got messed up with drugs found with a gun 50 months. He pulled 50 months. He is on [employer] and probation for the next 6 months. States the interactions with his ex GF brought him really down as he does not think there is much he can do for her. She just left back to [place]. 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 Associated Signs/Symptoms: Depression Symptoms:  anxiety, panic attacks, (Hypo) Manic Symptoms:  Distractibility, Impulsivity, Irritable Mood, Labiality of  Mood, Anxiety Symptoms:  Excessive Worry, Panic Symptoms, Psychotic Symptoms:  denies PTSD Symptoms: Negative Total Time spent with patient: 45 minutes  Past Psychiatric History:   Risk to Self: Is patient at risk for suicide?: Yes Risk to Others:   Prior Inpatient Therapy:  [facility] 2006 Prior Outpatient Therapy:  [facility] in [place]  Alcohol Screening: 1. How often do you have a drink containing alcohol?: Monthly or less 2. How many drinks containing alcohol do you have on a typical day when you are drinking?: 1 or 2 3. How often do you have six or more drinks on one occasion?: Never Preliminary Score: 0 9. Have you or someone else been injured as a result of your drinking?: No 10. Has a relative or friend or a doctor or another health worker been concerned about your drinking or suggested you cut down?: No Alcohol Use Disorder Identification Test Final Score (AUDIT): 1 Brief Intervention: AUDIT score less than 7 or less-screening does not suggest unhealthy drinking-brief intervention not indicated Substance Abuse History in the last 12 months:  Yes.   Consequences of Substance Abuse: Legal Consequences:  drug related one DWI Previous Psychotropic Medications: Yes Buspar Seroquel 50 Prozac Adderall Concerta Ritalin Psychological Evaluations: No  Past Medical History:  Past Medical History  Diagnosis Date  . Polysubstance abuse   . Suicidal thoughts    History reviewed. No pertinent past surgical history. Family History: History reviewed. No pertinent family history.  Family Psychiatric  History: Father alcohol denies family history of Bipolar Disorder or ADHD Social History:  History  Alcohol Use  . Yes     History  Drug Use  . Yes    Social History   Social History  . Marital Status: Single    Spouse Name: N/A  . Number of Children: N/A  . Years of Education: N/A   Social History Main Topics  . Smoking status: Current Every Day Smoker   . Smokeless tobacco: None  . Alcohol Use: Yes  . Drug Use: Yes  . Sexual Activity: Not Asked   Other Topics Concern  . None   Social History Narrative  Graduated HS was diagnosed with ADHD. Works as the Conservation officer, historic buildings. States he is still married but  separated .  He left to prison and she moved on.   She is currently with someone but tells him she wants to be back. He was with this GF who he really loves who left to [place] and then came back in active addiction. She want back to [place]. He dating a new GF who is not using drugs. He lives with a 34 Y/O roommate who is a good influence and watches after him Additional Social History:                         Allergies:  No Known Allergies Lab Results:  Results for orders placed or performed during the hospital encounter of 06/18/15 (from the past 48 hour(s))  Comprehensive metabolic panel     Status: None   Collection Time: 06/18/15 12:19 PM  Result Value Ref Range   Sodium 139 135 - 145 mmol/L   Potassium 3.9 3.5 - 5.1 mmol/L   Chloride 108 101 - 111 mmol/L   CO2 26 22 - 32 mmol/L   Glucose, Bld 92 65 - 99 mg/dL   BUN 11 6 - 20 mg/dL   Creatinine, Ser 0.85 0.61 - 1.24 mg/dL   Calcium 9.2 8.9 - 10.3 mg/dL   Total Protein 6.7 6.5 - 8.1 g/dL   Albumin 3.8 3.5 - 5.0 g/dL   AST 31 15 - 41 U/L   ALT 44 17 - 63 U/L   Alkaline Phosphatase 55 38 - 126 U/L   Total Bilirubin 0.6 0.3 - 1.2 mg/dL   GFR calc non Af Amer >60 >60 mL/min   GFR calc Af Amer >60 >60 mL/min    Comment: (NOTE) The eGFR has been calculated using the CKD EPI equation. This calculation has not been validated in all clinical situations. eGFR's persistently <60 mL/min signify possible Chronic Kidney Disease.    Anion gap 5 5 - 15  Ethanol     Status: None   Collection Time: 06/18/15 12:19 PM  Result Value Ref Range   Alcohol, Ethyl (B) <5 <5 mg/dL    Comment:        LOWEST DETECTABLE LIMIT FOR SERUM ALCOHOL IS 5 mg/dL FOR MEDICAL PURPOSES ONLY   CBC with  Diff     Status: None   Collection Time: 06/18/15 12:19 PM  Result Value Ref Range   WBC 7.1 4.0 - 10.5 K/uL   RBC 4.40 4.22 - 5.81 MIL/uL   Hemoglobin 14.5 13.0 - 17.0 g/dL   HCT 42.4 39.0 - 52.0 %   MCV 96.4 78.0 - 100.0 fL   MCH 33.0 26.0 - 34.0 pg   MCHC 34.2 30.0 - 36.0 g/dL   RDW  12.6 11.5 - 15.5 %   Platelets 239 150 - 400 K/uL   Neutrophils Relative % 67 %   Neutro Abs 4.7 1.7 - 7.7 K/uL   Lymphocytes Relative 22 %   Lymphs Abs 1.6 0.7 - 4.0 K/uL   Monocytes Relative 7 %   Monocytes Absolute 0.5 0.1 - 1.0 K/uL   Eosinophils Relative 4 %   Eosinophils Absolute 0.3 0.0 - 0.7 K/uL   Basophils Relative 0 %   Basophils Absolute 0.0 0.0 - 0.1 K/uL  Urine rapid drug screen (hosp performed)not at [facility]     Status: Abnormal   Collection Time: 06/18/15  1:03 PM  Result Value Ref Range   Opiates NONE DETECTED NONE DETECTED   Cocaine NONE DETECTED NONE DETECTED   Benzodiazepines NONE DETECTED NONE DETECTED   Amphetamines POSITIVE (A) NONE DETECTED   Tetrahydrocannabinol POSITIVE (A) NONE DETECTED   Barbiturates NONE DETECTED NONE DETECTED    Comment:        DRUG SCREEN FOR MEDICAL PURPOSES ONLY.  IF CONFIRMATION IS NEEDED FOR ANY PURPOSE, NOTIFY LAB WITHIN 5 DAYS.        LOWEST DETECTABLE LIMITS FOR URINE DRUG SCREEN Drug Class       Cutoff (ng/mL) Amphetamine      1000 Barbiturate      200 Benzodiazepine   496 Tricyclics       759 Opiates          300 Cocaine          300 THC              50   Urinalysis, Routine w reflex microscopic (not at [facility])     Status: Abnormal   Collection Time: 06/18/15  1:03 PM  Result Value Ref Range   Color, Urine YELLOW YELLOW   APPearance CLOUDY (A) CLEAR   Specific Gravity, Urine 1.024 1.005 - 1.030   pH 7.0 5.0 - 8.0   Glucose, UA NEGATIVE NEGATIVE mg/dL   Hgb urine dipstick SMALL (A) NEGATIVE   Bilirubin Urine NEGATIVE NEGATIVE   Ketones, ur NEGATIVE NEGATIVE mg/dL   Protein, ur NEGATIVE NEGATIVE mg/dL   Urobilinogen, UA 1.0  0.0 - 1.0 mg/dL   Nitrite NEGATIVE NEGATIVE   Leukocytes, UA NEGATIVE NEGATIVE  Urine microscopic-add on     Status: Abnormal   Collection Time: 06/18/15  1:03 PM  Result Value Ref Range   WBC, UA 0-2 <3 WBC/hpf   RBC / HPF 7-10 <3 RBC/hpf   Bacteria, UA RARE RARE   Crystals CA OXALATE CRYSTALS (A) NEGATIVE   Urine-Other MUCOUS PRESENT     Comment: AMORPHOUS URATES/PHOSPHATES    Metabolic Disorder Labs:  No results found for: HGBA1C, MPG No results found for: PROLACTIN No results found for: CHOL, TRIG, HDL, CHOLHDL, VLDL, LDLCALC  Current Medications: Current Facility-Administered Medications  Medication Dose Route Frequency Provider Last Rate Last Dose  . [START ON 06/20/2015] Influenza vac split quadrivalent PF (FLUARIX) injection 0.5 mL  0.5 mL Intramuscular Tomorrow-1000 Nicholaus Bloom, MD      . LORazepam (ATIVAN) tablet 0.5 mg  0.5 mg Oral Q6H PRN Niel Hummer, NP   0.5 mg at 06/18/15 2220  . magnesium hydroxide (MILK OF MAGNESIA) suspension 30 mL  30 mL Oral Daily PRN Niel Hummer, NP      . nicotine (NICODERM CQ - dosed in mg/24 hours) patch 21 mg  21 mg Transdermal Daily Nicholaus Bloom, MD   21 mg at 06/19/15  45  Derrill Memo ON 06/20/2015] pneumococcal 23 valent vaccine (PNU-IMMUNE) injection 0.5 mL  0.5 mL Intramuscular Tomorrow-1000 Nicholaus Bloom, MD      . traZODone (DESYREL) tablet 50 mg  50 mg Oral QHS PRN Niel Hummer, NP   50 mg at 06/18/15 2220   PTA Medications: Prescriptions prior to admission  Medication Sig Dispense Refill Last Dose  . albuterol (PROVENTIL HFA;VENTOLIN HFA) 108 (90 BASE) MCG/ACT inhaler Inhale 2 puffs into the lungs every 4 (four) hours as needed for wheezing or shortness of breath.   06/17/2015 at Unknown time    Musculoskeletal: Strength & Muscle Tone: within normal limits Gait & Station: normal Patient leans: normal  Psychiatric Specialty Exam: Physical Exam  Review of Systems  Constitutional: Negative.   HENT: Negative.   Eyes:  Negative.   Respiratory:       Pack every 4-5 day  Cardiovascular: Positive for chest pain and palpitations.  Gastrointestinal: Positive for heartburn.  Genitourinary: Negative.   Musculoskeletal: Negative.   Skin: Negative.   Neurological: Negative.   Endo/Heme/Allergies: Negative.   Psychiatric/Behavioral: Positive for substance abuse. The patient is nervous/anxious.     Blood pressure 111/84, pulse 121, temperature 97.4 F (36.3 C), temperature source Oral, resp. rate 16, height _0  (1.803 m), weight 72.576 kg (160 lb), SpO2 99 %.Body mass index is 22.33 kg/(m^2).  General Appearance: Fairly Groomed  Engineer, water::  Fair  Speech:  Clear and Coherent  Volume:  fluctuates  Mood:  Anxious and worried as needs to be at work  Affect:  Appropriate, Tearful and tearful when talking about his GF and waht she is going trough  Thought Process:  Coherent and Goal Directed  Orientation:  Full (Time, Place, and Person)  Thought Content:  symptoms events worries concerns  Suicidal Thoughts:  No  Homicidal Thoughts:  No  Memory:  Immediate;   Fair Recent;   Fair Remote;   Fair  Judgement:  Fair  Insight:  Present  Psychomotor Activity:  Restlessness  Concentration:  Fair  Recall:  [employer] of Knowledge:Fair  Language: Fair  Akathisia:  No  Handed:  Right  AIMS (if indicated):     Assets:  Desire for Improvement Housing Social Support Vocational/Educational  ADL's:  Intact  Cognition: WNL  Sleep:  Number of Hours: 6.25     Treatment Plan Summary: Daily contact with patient to assess and evaluate symptoms and progress in treatment and Medication management  Observation Level/Precautions:  15 minute checks  Laboratory:  As per the ED  Psychotherapy:  Individual/group  Medications:  Will reassess for the need of psychotropics  Consultations:    Discharge Concerns:    Estimated LOS: wants to be D/C today as he has to be at work  Other:     I certify that inpatient  services furnished can reasonably be expected to improve the patient's condition.   Dejha King A 11/2/201610:11 AM

## 2015-06-19 NOTE — Progress Notes (Addendum)
Pt discharged home with girlfriend. Pt was ambulatory and well. All papers and prescriptions were given and valuables returned. [place]  understanding expressed. Denies SI/HI and A/VH. Pt given opportunity to express concerns and asked questions.

## 2015-06-19 NOTE — [facility] Group Notes (Signed)
   [facility]  06/19/2015  8:45 AM   Participation Quality: Alert, Appropriate and Oriented  Mood/Affect: Appropriate  Depression Rating: 0  Anxiety Rating: 5  Thoughts of Suicide: Pt denies SI/HI  Will you contract for safety? Yes  Current AVH: Pt denies  Plan for Discharge/Comments: Pt attended discharge planning group and actively participated in group. CSW provided pt with today's workbook. Patient reports feeling eager to return home to follow up with outpatient services. He reports that he follows up with [facility].   Transportation Means: Pt reports access to transportation  Supports: No supports mentioned at this time  Samuella BruinKristin Lexie Koehl, MSW, [employer]CSWA Clinical Social Worker [facility] [phone]   r

## 2015-06-19 NOTE — Discharge Summary (Signed)
Physician Discharge Summary Note  Patient:  Frank Patterson is an 29 y.o., male MRN:  [id number] DOB:  22-Jun-1986 Patient phone:  [phone] (home)  Patient address:   [address]  [place] [address],  Total Time spent with patient: 45 minutes  Date of Admission:  06/18/2015 Date of Discharge: 06/19/2015  Reason for Admission:   History of Present Illness:: 29 Y/O male who states he tried meth once last week for the first time. He snorted it.. States he was feeling paranoid. Went to [facility] was given IV fluids. Monday night states he went to a friends house and it was offered to him ( Methamphetamine, Amphetamines). States he was having a hard time after aving seen his ex GF who was doing really bad on heroin. . Initially he states he did not see much going on but later that night he started feeling the full effect of the drug. Got paranoid. Felt disgusted with himself he cut his wrist. He dozed off and when he woke up he realized what he has done and called 911. States he was diagnosed ADHD as a child he was and still is " high strung". Then when he went to [facility] in 2006 he was diagnosed with Bipolar Disordes States he has episodes of increased anxiety. States that he is a very paranoid kind of guy. Left home when he was 16. He has been on and off prison several times. Got messed up with drugs found with a gun 50 months. He pulled 50 months. He is on [employer] and probation for the next 6 months. States the interactions with his ex GF brought him really down as he does not think there is much he can do for her. She just left back to [place]. 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  Principal Problem: Psychoactive substance-induced mood disorder [facility]) Discharge Diagnoses: Patient Active Problem List   Diagnosis Date Noted  . Psychoactive substance-induced mood disorder ([place])  [I94.85, F06.30] 06/19/2015    Priority: High  . Polysubstance abuse [F19.10] 06/18/2015    Priority: High    Musculoskeletal: Strength & Muscle Tone: within normal limits Gait & Station: normal Patient leans: N/A  Psychiatric Specialty Exam: Physical Exam  Review of Systems  Psychiatric/Behavioral: Positive for depression and substance abuse. Negative for suicidal ideas and hallucinations. The patient is nervous/anxious and has insomnia.   All other systems reviewed and are negative.   Blood pressure 111/84, pulse 121, temperature 97.4 F (36.3 C), temperature source Oral, resp. rate 16, height 5' 11"  (1.803 m), weight 72.576 kg (160 lb), SpO2 99 %.Body mass index is 22.33 kg/(m^2).  SEE MD PSE within the SRA   Have you used any form of tobacco in the last 30 days? (Cigarettes, Smokeless Tobacco, Cigars, and/or Pipes): Yes  Has this patient used any form of tobacco in the last 30 days? (Cigarettes, Smokeless Tobacco, Cigars, and/or Pipes) Yes, A prescription for an FDA-approved tobacco cessation medication was offered at discharge and the patient accepted.   Past Medical History:  Past Medical History  Diagnosis Date  . Polysubstance abuse   . Suicidal thoughts    History reviewed. No pertinent past surgical history. Family History: History reviewed. No pertinent family history. Social History:  History  Alcohol Use  . Yes     History  Drug Use  .  Yes    Social History   Social History  . Marital Status: Single    Spouse Name: N/A  . Number of Children: N/A  . Years of Education: N/A   Social History Main Topics  . Smoking status: Current Every Day Smoker  . Smokeless tobacco: None  . Alcohol Use: Yes  . Drug Use: Yes  . Sexual Activity: Not Asked   Other Topics Concern  . None   Social History Narrative    Risk to Self: Is patient at risk for suicide?: Yes Risk to Others:   Prior Inpatient Therapy:   Prior Outpatient Therapy:    Level of Care:   OP  Hospital Course:   Caron Tardif was admitted for Psychoactive substance-induced mood disorder ([place]), and crisis management.  Pt was treated discharged with the medications listed below under Medication List.  Medical problems were identified and treated as needed.  Home medications were restarted as appropriate.  Improvement was monitored by observation and Jake Shark 's daily report of symptom reduction.  Emotional and mental status was monitored by daily self-inventory reports completed by Jake Shark and clinical staff.         Desjuan Stearns was evaluated by the treatment team for stability and plans for continued recovery upon discharge. Khallid Pasillas 's motivation was an integral factor for scheduling further treatment. Employment, transportation, bed availability, health status, family support, and any pending legal issues were also considered during hospital stay. Pt was offered further treatment options upon discharge including but not limited to Residential, Intensive Outpatient, and Outpatient treatment.  Kerim Statzer will follow up with the services as listed below under Follow Up Information.     Upon completion of this admission the patient was both mentally and medically stable for discharge denying suicidal/homicidal ideation, auditory/visual/tactile hallucinations, delusional thoughts and paranoia.    Pt was on the unit and was evaluated, then determined not to be experiencing any risk or symptoms for acute withdrawal. Therefore, due to pt's desire to leave in combination with minimal risk to pt and his work commitments, pt is discharging today. Pt is enthusiastic about outpatient followup.   Consults:  None  Significant Diagnostic Studies:  UDS positive for amphetamines and THC  Discharge Vitals:   Blood pressure 111/84, pulse 121, temperature 97.4 F (36.3 C), temperature source Oral, resp. rate 16, height 5' 11"  (1.803 m), weight 72.576 kg (160 lb), SpO2 99  %. Body mass index is 22.33 kg/(m^2). Lab Results:   Results for orders placed or performed during the hospital encounter of 06/18/15 (from the past 72 hour(s))  Comprehensive metabolic panel     Status: None   Collection Time: 06/18/15 12:19 PM  Result Value Ref Range   Sodium 139 135 - 145 mmol/L   Potassium 3.9 3.5 - 5.1 mmol/L   Chloride 108 101 - 111 mmol/L   CO2 26 22 - 32 mmol/L   Glucose, Bld 92 65 - 99 mg/dL   BUN 11 6 - 20 mg/dL   Creatinine, Ser 0.85 0.61 - 1.24 mg/dL   Calcium 9.2 8.9 - 10.3 mg/dL   Total Protein 6.7 6.5 - 8.1 g/dL   Albumin 3.8 3.5 - 5.0 g/dL   AST 31 15 - 41 U/L   ALT 44 17 - 63 U/L   Alkaline Phosphatase 55 38 - 126 U/L   Total Bilirubin 0.6 0.3 - 1.2 mg/dL   GFR calc non Af Amer >60 >60 mL/min   GFR calc Af Amer >  60 >60 mL/min    Comment: (NOTE) The eGFR has been calculated using the CKD EPI equation. This calculation has not been validated in all clinical situations. eGFR's persistently <60 mL/min signify possible Chronic Kidney Disease.    Anion gap 5 5 - 15  Ethanol     Status: None   Collection Time: 06/18/15 12:19 PM  Result Value Ref Range   Alcohol, Ethyl (B) <5 <5 mg/dL    Comment:        LOWEST DETECTABLE LIMIT FOR SERUM ALCOHOL IS 5 mg/dL FOR MEDICAL PURPOSES ONLY   CBC with Diff     Status: None   Collection Time: 06/18/15 12:19 PM  Result Value Ref Range   WBC 7.1 4.0 - 10.5 K/uL   RBC 4.40 4.22 - 5.81 MIL/uL   Hemoglobin 14.5 13.0 - 17.0 g/dL   HCT 42.4 39.0 - 52.0 %   MCV 96.4 78.0 - 100.0 fL   MCH 33.0 26.0 - 34.0 pg   MCHC 34.2 30.0 - 36.0 g/dL   RDW 12.6 11.5 - 15.5 %   Platelets 239 150 - 400 K/uL   Neutrophils Relative % 67 %   Neutro Abs 4.7 1.7 - 7.7 K/uL   Lymphocytes Relative 22 %   Lymphs Abs 1.6 0.7 - 4.0 K/uL   Monocytes Relative 7 %   Monocytes Absolute 0.5 0.1 - 1.0 K/uL   Eosinophils Relative 4 %   Eosinophils Absolute 0.3 0.0 - 0.7 K/uL   Basophils Relative 0 %   Basophils Absolute 0.0 0.0 - 0.1  K/uL  Urine rapid drug screen (hosp performed)not at [facility]     Status: Abnormal   Collection Time: 06/18/15  1:03 PM  Result Value Ref Range   Opiates NONE DETECTED NONE DETECTED   Cocaine NONE DETECTED NONE DETECTED   Benzodiazepines NONE DETECTED NONE DETECTED   Amphetamines POSITIVE (A) NONE DETECTED   Tetrahydrocannabinol POSITIVE (A) NONE DETECTED   Barbiturates NONE DETECTED NONE DETECTED    Comment:        DRUG SCREEN FOR MEDICAL PURPOSES ONLY.  IF CONFIRMATION IS NEEDED FOR ANY PURPOSE, NOTIFY LAB WITHIN 5 DAYS.        LOWEST DETECTABLE LIMITS FOR URINE DRUG SCREEN Drug Class       Cutoff (ng/mL) Amphetamine      1000 Barbiturate      200 Benzodiazepine   314 Tricyclics       970 Opiates          300 Cocaine          300 THC              50   Urinalysis, Routine w reflex microscopic (not at [facility])     Status: Abnormal   Collection Time: 06/18/15  1:03 PM  Result Value Ref Range   Color, Urine YELLOW YELLOW   APPearance CLOUDY (A) CLEAR   Specific Gravity, Urine 1.024 1.005 - 1.030   pH 7.0 5.0 - 8.0   Glucose, UA NEGATIVE NEGATIVE mg/dL   Hgb urine dipstick SMALL (A) NEGATIVE   Bilirubin Urine NEGATIVE NEGATIVE   Ketones, ur NEGATIVE NEGATIVE mg/dL   Protein, ur NEGATIVE NEGATIVE mg/dL   Urobilinogen, UA 1.0 0.0 - 1.0 mg/dL   Nitrite NEGATIVE NEGATIVE   Leukocytes, UA NEGATIVE NEGATIVE  Urine microscopic-add on     Status: Abnormal   Collection Time: 06/18/15  1:03 PM  Result Value Ref Range   WBC, UA 0-2 <3 WBC/hpf  RBC / HPF 7-10 <3 RBC/hpf   Bacteria, UA RARE RARE   Crystals CA OXALATE CRYSTALS (A) NEGATIVE   Urine-Other MUCOUS PRESENT     Comment: AMORPHOUS URATES/PHOSPHATES    Physical Findings: AIMS:  , ,  ,  ,    CIWA:    COWS:      See Psychiatric Specialty Exam and Suicide Risk Assessment completed by Attending Physician prior to discharge.  Discharge destination:  Home  Is patient on multiple antipsychotic therapies at discharge:  No    Has Patient had three or more failed trials of antipsychotic monotherapy by history:  No    Recommended Plan for Multiple Antipsychotic Therapies: NA     Medication List    TAKE these medications      Indication   albuterol 108 (90 BASE) MCG/ACT inhaler  Commonly known as:  PROVENTIL HFA;VENTOLIN HFA  Inhale 2 puffs into the lungs every 4 (four) hours as needed for wheezing or shortness of breath.      nicotine 21 mg/24hr patch  Commonly known as:  NICODERM CQ - dosed in mg/24 hours  Place 1 patch (21 mg total) onto the skin daily.      traZODone 50 MG tablet  Commonly known as:  DESYREL  Take 1 tablet (50 mg total) by mouth at bedtime as needed for sleep.   Indication:  Trouble Sleeping           Follow-up Information    Follow up with Patient declines referral follow up services at this time. .      Follow-up recommendations:  Activity:  As tolerated Diet:  Heart healthy with low sodium  Comments:   Take all medications as prescribed. Keep all follow-up appointments as scheduled.  Do not consume alcohol or use illegal drugs while on prescription medications. Report any adverse effects from your medications to your primary care provider promptly.  In the event of recurrent symptoms or worsening symptoms, call 911, a crisis hotline, or go to the nearest emergency department for evaluation.   Total Discharge Time: Greater than 30 minutes  Signed: Benjamine Mola, FNP-BC 06/19/2015, 1:24 PM  I personally assessed the patient and formulated the plan Geralyn Flash A. Sabra Heck, M.D.

## 2015-06-19 NOTE — [facility] Suicide Risk Assessment (Signed)
[facility] INPATIENT:  Family/Significant Other Suicide Prevention Education  Suicide Prevention Education:  Patient Refusal for Family/Significant Other Suicide Prevention Education: The patient Frank Patterson has refused to provide written consent for family/significant other to be provided Family/Significant Other Suicide Prevention Education during admission and/or prior to discharge.  Physician notified. SPE reviewed with patient and brochure provided. Patient encouraged to return to hospital if having suicidal thoughts, patient verbalized his/her understanding and has no further questions at this time.   Teron Blais, West CarboKristin L 06/19/2015, 1:00 PM

## 2015-06-19 NOTE — [facility] Suicide Risk Assessment (Signed)
[facility]   Demographic Factors:  Male and Caucasian  Total Time spent with patient: 45 minutes  Musculoskeletal: Strength & Muscle Tone: within normal limits Gait & Station: normal Patient leans: normal  Psychiatric Specialty Exam: Physical Exam  ROS  Blood pressure 111/84, pulse 121, temperature 97.4 F (36.3 C), temperature source Oral, resp. rate 16, height 5\' 11"  (1.803 m), weight 72.576 kg (160 lb), SpO2 99 %.Body mass index is 22.33 kg/(m^2).  General Appearance: Fairly Groomed  Patent attorneyye Contact::  Fair  Speech:  Clear and Coherent409  Volume:  fluctuates  Mood:  Euthymic  Affect:  Appropriate  Thought Process:  Coherent and Goal Directed  Orientation:  Full (Time, Place, and Person)  Thought Content:  plans as he moves on, relapse prevention plan  Suicidal Thoughts:  No  Homicidal Thoughts:  No  Memory:  Immediate;   Fair Recent;   Fair Remote;   Fair  Judgement:  Fair  Insight:  Present  Psychomotor Activity:  Restlessness  Concentration:  Fair  Recall:  [employer]Fair  Fund of Knowledge:Fair  Language: Fair  Akathisia:  No  Handed:  Right  AIMS (if indicated):     Assets:  Desire for Improvement Housing Resilience Social Support Talents/Skills Vocational/Educational  Sleep:  Number of Hours: 6.25  Cognition: WNL  ADL's:  Intact   Have you used any form of tobacco in the last 30 days? (Cigarettes, Smokeless Tobacco, Cigars, and/or Pipes): Yes  Has this patient used any form of tobacco in the last 30 days? (Cigarettes, Smokeless Tobacco, Cigars, and/or Pipes) Yes, A prescription for an FDA-approved tobacco cessation medication was offered at discharge and the patient refused  Mental Status Per Nursing Assessment::   On Admission:     Current Mental Status by Physician: In full contact with reality. There are no active S/S of withdrawal. There are no active SI plans or intent. He states he is willing and motivated to avoid the people who  keep influencing him to use the drug. States he realizes he has 6 months left on his probation and he does not want to jeopardize it. He states he recognizes he still needs to deal with the loss of the relationship with the GF and that it is going to take time to heal. He is going to pursue outpatient therapy   Loss Factors: Loss of significant relationship  Historical Factors: NA  Risk Reduction Factors:   Sense of responsibility to family, Employed, Living with another person, especially a relative and Positive social support  Continued Clinical Symptoms:  Alcohol/Substance Abuse/Dependencies  Cognitive Features That Contribute To Risk:  None    Suicide Risk:  Minimal: No identifiable suicidal ideation.  Patients presenting with no risk factors but with morbid ruminations; may be classified as minimal risk based on the severity of the depressive symptoms  Principal Problem: Psychoactive substance-induced mood disorder [facility](HCC) Discharge Diagnoses:  Patient Active Problem List   Diagnosis Date Noted  . Psychoactive substance-induced mood disorder ([facility]) [Z61.09[F19.94, F06.30] 06/19/2015  . Polysubstance abuse [F19.10] 06/18/2015      Plan Of Care/Follow-up recommendations:  Activity:  as tolerated Diet:  regular Follow up outpatient basis Is patient on multiple antipsychotic therapies at discharge:  No   Has Patient had three or more failed trials of antipsychotic monotherapy by history:  No  Recommended Plan for Multiple Antipsychotic Therapies: NA    Riannon Mukherjee A 06/19/2015, 12:39 PM

## 2015-06-19 NOTE — [facility] Counselor (Signed)
No PSA completed as patient discharged prior to 72 hours.  Samuella BruinKristin Evalin Shawhan, MSW, Amgen IncLCSWA Clinical Social Worker [facility] [phone]

## 2015-06-20 ENCOUNTER — Encounter (HOSPITAL_COMMUNITY): Payer: Self-pay | Admitting: Emergency Medicine

## 2015-08-19 ENCOUNTER — Emergency Department (HOSPITAL_COMMUNITY)
Admission: EM | Admit: 2015-08-19 | Discharge: 2015-08-20 | Disposition: A | Discharge status: Other Acute Inpatient Hospital | Payer: Self-pay | Attending: Emergency Medicine | Admitting: Emergency Medicine

## 2015-08-19 ENCOUNTER — Encounter (HOSPITAL_COMMUNITY): Payer: Self-pay | Admitting: *Deleted

## 2015-08-19 ENCOUNTER — Emergency Department (HOSPITAL_COMMUNITY): Payer: Self-pay

## 2015-08-19 DIAGNOSIS — R4689 Other symptoms and signs involving appearance and behavior: Secondary | ICD-10-CM

## 2015-08-19 DIAGNOSIS — F1110 Opioid abuse, uncomplicated: Secondary | ICD-10-CM | POA: Insufficient documentation

## 2015-08-19 DIAGNOSIS — Z79899 Other long term (current) drug therapy: Secondary | ICD-10-CM | POA: Insufficient documentation

## 2015-08-19 DIAGNOSIS — F1510 Other stimulant abuse, uncomplicated: Secondary | ICD-10-CM | POA: Insufficient documentation

## 2015-08-19 DIAGNOSIS — R000 Tachycardia, unspecified: Secondary | ICD-10-CM | POA: Insufficient documentation

## 2015-08-19 DIAGNOSIS — F1210 Cannabis abuse, uncomplicated: Secondary | ICD-10-CM | POA: Insufficient documentation

## 2015-08-19 DIAGNOSIS — R4589 Other symptoms and signs involving emotional state: Secondary | ICD-10-CM

## 2015-08-19 DIAGNOSIS — J45909 Unspecified asthma, uncomplicated: Secondary | ICD-10-CM | POA: Insufficient documentation

## 2015-08-19 DIAGNOSIS — F1410 Cocaine abuse, uncomplicated: Secondary | ICD-10-CM | POA: Insufficient documentation

## 2015-08-19 DIAGNOSIS — R451 Restlessness and agitation: Secondary | ICD-10-CM | POA: Insufficient documentation

## 2015-08-19 DIAGNOSIS — F17210 Nicotine dependence, cigarettes, uncomplicated: Secondary | ICD-10-CM | POA: Insufficient documentation

## 2015-08-19 LAB — CBC
HCT: 40 % (ref 39.0–52.0)
HEMOGLOBIN: 13.7 g/dL (ref 13.0–17.0)
MCH: 32.2 pg (ref 26.0–34.0)
MCHC: 34.3 g/dL (ref 30.0–36.0)
MCV: 94.1 fL (ref 78.0–100.0)
PLATELETS: 435 10*3/uL — AB (ref 150–400)
RBC: 4.25 MIL/uL (ref 4.22–5.81)
RDW: 11.9 % (ref 11.5–15.5)
WBC: 12.8 10*3/uL — ABNORMAL HIGH (ref 4.0–10.5)

## 2015-08-19 LAB — RAPID URINE DRUG SCREEN, HOSP PERFORMED
AMPHETAMINES: POSITIVE — AB
Barbiturates: NOT DETECTED
Benzodiazepines: NOT DETECTED
Cocaine: POSITIVE — AB
Opiates: POSITIVE — AB
TETRAHYDROCANNABINOL: POSITIVE — AB

## 2015-08-19 LAB — COMPREHENSIVE METABOLIC PANEL
ALK PHOS: 65 U/L (ref 38–126)
ALT: 41 U/L (ref 17–63)
AST: 21 U/L (ref 15–41)
Albumin: 3.2 g/dL — ABNORMAL LOW (ref 3.5–5.0)
Anion gap: 10 (ref 5–15)
BUN: 12 mg/dL (ref 6–20)
CALCIUM: 9.3 mg/dL (ref 8.9–10.3)
CHLORIDE: 103 mmol/L (ref 101–111)
CO2: 28 mmol/L (ref 22–32)
CREATININE: 0.87 mg/dL (ref 0.61–1.24)
Glucose, Bld: 92 mg/dL (ref 65–99)
Potassium: 4.1 mmol/L (ref 3.5–5.1)
Sodium: 141 mmol/L (ref 135–145)
Total Bilirubin: 0.1 mg/dL — ABNORMAL LOW (ref 0.3–1.2)
Total Protein: 6.6 g/dL (ref 6.5–8.1)

## 2015-08-19 LAB — SALICYLATE LEVEL: Salicylate Lvl: <4 mg/dL (ref 2.8–30.0)

## 2015-08-19 LAB — ACETAMINOPHEN LEVEL: Acetaminophen (Tylenol), Serum: <10 ug/mL — ABNORMAL LOW (ref 10–30)

## 2015-08-19 LAB — ETHANOL

## 2015-08-19 MED ORDER — LORAZEPAM 1 MG PO TABS: 0.0000 mg | ORAL_TABLET | Freq: Four times a day (QID) | ORAL | Status: DC

## 2015-08-19 MED ORDER — IBUPROFEN 200 MG PO TABS
600.0000 mg | ORAL_TABLET | Freq: Three times a day (TID) | ORAL | Status: DC | PRN
  Administered 2015-08-20: 600 mg via ORAL
  Filled 2015-08-19: qty 1

## 2015-08-19 MED ORDER — LORAZEPAM 2 MG/ML IJ SOLN: 0.0000 mg | Freq: Two times a day (BID) | INTRAMUSCULAR | Status: DC

## 2015-08-19 MED ORDER — ACETAMINOPHEN 325 MG PO TABS: 650.0000 mg | ORAL_TABLET | ORAL | Status: DC | PRN

## 2015-08-19 MED ORDER — NICOTINE 21 MG/24HR TD PT24
21.0000 mg | MEDICATED_PATCH | Freq: Every day | TRANSDERMAL | Status: DC
  Administered 2015-08-19: 21 mg via TRANSDERMAL
  Filled 2015-08-19: qty 1

## 2015-08-19 MED ORDER — ONDANSETRON HCL 4 MG PO TABS: 4.0000 mg | ORAL_TABLET | Freq: Three times a day (TID) | ORAL | Status: DC | PRN

## 2015-08-19 MED ORDER — LORAZEPAM 1 MG PO TABS
1.0000 mg | ORAL_TABLET | Freq: Three times a day (TID) | ORAL | Status: DC | PRN
  Administered 2015-08-20: 1 mg via ORAL
  Filled 2015-08-19: qty 1

## 2015-08-19 MED ORDER — SODIUM CHLORIDE 0.9 % IV BOLUS (SEPSIS)
1000.0000 mL | Freq: Once | INTRAVENOUS | Status: AC
  Administered 2015-08-19: 1000 mL via INTRAVENOUS

## 2015-08-19 MED ORDER — VITAMIN B-1 100 MG PO TABS
100.0000 mg | ORAL_TABLET | Freq: Every day | ORAL | Status: DC
Start: 2015-08-19 — End: 2015-08-20
  Administered 2015-08-19: 100 mg via ORAL
  Filled 2015-08-19: qty 1

## 2015-08-19 MED ORDER — THIAMINE HCL 100 MG/ML IJ SOLN
100.0000 mg | Freq: Every day | INTRAMUSCULAR | Status: DC
  Filled 2015-08-19: qty 2

## 2015-08-19 MED ORDER — LORAZEPAM 1 MG PO TABS: 0.0000 mg | ORAL_TABLET | Freq: Two times a day (BID) | ORAL | Status: DC

## 2015-08-19 MED ORDER — LORAZEPAM 2 MG/ML IJ SOLN: 0.0000 mg | Freq: Four times a day (QID) | INTRAMUSCULAR | Status: DC

## 2015-08-19 MED ORDER — ALUM & MAG HYDROXIDE-SIMETH 200-200-20 MG/5ML PO SUSP: 30.0000 mL | ORAL | Status: DC | PRN

## 2015-08-19 NOTE — Progress Notes (Addendum)
Patient has been referred to: [facility] - per Mickeal SkinnerPhoebe, fax referral for review. [name] - per [place], fax referral. [facility] - per Alona BeneJoyce, d/c on 1/03, fax it[facility] - [facility] intake, fax referral for waitlist. [facility] - per French [name], adult, adolescent, and 2geriatric beds open.  At capacity: [facility] - per [name], geriatric beds only  CSW will continue to seek placement.  [place] Abtsatia [employer], LCSWA Disposition staff 08/19/2015 10:30 PM

## 2015-08-19 NOTE — ED Notes (Signed)
Pt wanded by security. 

## 2015-08-19 NOTE — ED Notes (Signed)
Pt placed in purple scrubs, belongings moved from bedside.

## 2015-08-19 NOTE — ED Notes (Signed)
TTS currently in progress 

## 2015-08-19 NOTE — ED Notes (Signed)
telepsych set up at the bedside. 

## 2015-08-19 NOTE — ED Provider Notes (Addendum)
CSN: [id number]     Arrival date & time 08/19/15  1729 History   First MD Initiated Contact with Patient 08/19/15 1928     Chief Complaint  Patient presents with  . Drug Problem  . Suicidal     (Consider location/radiation/quality/duration/timing/severity/associated sxs/prior Treatment) Patient is a 30 y.o. male presenting with mental health disorder.  Mental Health Problem Presenting symptoms: suicidal thoughts   Presenting symptoms: no agitation and no delusions   Patient accompanied by:  Child Degree of incapacity (severity):  Mild Onset quality:  Gradual Timing:  Constant Chronicity:  New Context: not alcohol use   Treatment compliance:  Untreated Relieved by:  None tried Worsened by:  Nothing tried Ineffective treatments:  None tried Associated symptoms: no abdominal pain, no fatigue, no hypersomnia and no insomnia   Risk factors: no family hx of mental illness and no family violence     Past Medical History  Diagnosis Date  . Asthma   . Rosacea   . Polysubstance abuse   . Suicidal thoughts    History reviewed. No pertinent past surgical history. No family history on file. Social History  Substance Use Topics  . Smoking status: Current Every Day Smoker -- 0.50 packs/day    Types: Cigarettes  . Smokeless tobacco: None  . Alcohol Use: Yes     Comment: almost every day    Review of Systems  Constitutional: Negative for fever and fatigue.  Eyes: Negative for pain.  Respiratory: Negative for choking, chest tightness and shortness of breath.   Gastrointestinal: Negative for abdominal pain.  Psychiatric/Behavioral: Positive for suicidal ideas. Negative for agitation. The patient does not have insomnia.   All other systems reviewed and are negative.     Allergies  Vancomycin  Home Medications   Prior to Admission medications   Medication Sig Start Date End Date Taking? Authorizing Provider  albuterol (PROVENTIL HFA;VENTOLIN HFA) 108 (90 BASE) MCG/ACT  inhaler Inhale 2 puffs into the lungs every 4 (four) hours as needed for wheezing or shortness of breath. 06/29/13  Yes Fayrene Helper, PA-C  traZODone (DESYREL) 50 MG tablet Take 1 tablet (50 mg total) by mouth at bedtime as needed for sleep. 06/19/15  Yes Beau Fanny, FNP  albuterol (PROVENTIL HFA;VENTOLIN HFA) 108 (90 BASE) MCG/ACT inhaler Inhale 2 puffs into the lungs every 6 (six) hours as needed for wheezing or shortness of breath. Patient not taking: Reported on 08/19/2015 12/25/13   Francee Piccolo, PA-C  albuterol (PROVENTIL HFA;VENTOLIN HFA) 108 (90 BASE) MCG/ACT inhaler Inhale 1-2 puffs into the lungs every 6 (six) hours as needed for wheezing or shortness of breath. Patient not taking: Reported on 08/19/2015 03/01/14   Charlestine Night, PA-C  nicotine (NICODERM CQ - DOSED IN MG/24 HOURS) 21 mg/24hr patch Place 1 patch (21 mg total) onto the skin daily. Patient not taking: Reported on 08/19/2015 06/19/15   Beau Fanny, FNP   BP 102/81 mmHg  Pulse 90  Temp(Src) 98.9 F (37.2 C) (Oral)  Resp 18  SpO2 99% Physical Exam  Constitutional: He is oriented to person, place, and time. He appears well-developed and well-nourished.  HENT:  Head: Normocephalic and atraumatic.  Neck: Normal range of motion.  Cardiovascular: Tachycardia present.   Pulmonary/Chest: Effort normal. No respiratory distress.  Abdominal: He exhibits no distension.  Musculoskeletal: Normal range of motion.  Neurological: He is alert and oriented to person, place, and time. No cranial nerve deficit. Coordination normal.  Skin: Skin is warm and dry.  Psychiatric: He  is agitated. He is not aggressive. He expresses suicidal ideation. He expresses no suicidal plans.  Nursing note and vitals reviewed.   ED Course  Procedures (including critical care time) Labs Review Labs Reviewed  COMPREHENSIVE METABOLIC PANEL - Abnormal; Notable for the following:    Albumin 3.2 (*)    Total Bilirubin 0.1 (*)    All other  components within normal limits  ACETAMINOPHEN LEVEL - Abnormal; Notable for the following:    Acetaminophen (Tylenol), Serum <10 (*)    All other components within normal limits  CBC - Abnormal; Notable for the following:    WBC 12.8 (*)    Platelets 435 (*)    All other components within normal limits  URINE RAPID DRUG SCREEN, HOSP PERFORMED - Abnormal; Notable for the following:    Opiates POSITIVE (*)    Cocaine POSITIVE (*)    Amphetamines POSITIVE (*)    Tetrahydrocannabinol POSITIVE (*)    All other components within normal limits  ETHANOL  SALICYLATE LEVEL    Imaging Review Dg Chest 2 View  08/19/2015  CLINICAL DATA:  Drug abuse, requesting detox. Amphetamine use this morning. Suicidal ideation. Hypoalbuminemia and leukocytosis, assess for infection. EXAM: CHEST  2 VIEW COMPARISON:  None. FINDINGS: The lungs appear clear. No evidence of active pulmonary tuberculosis. Cardiac and mediastinal margins appear normal.  No pleural effusion. IMPRESSION: 1. No significant thoracic abnormality observed. Electronically Signed   By: Gaylyn RongWalter  Liebkemann M.D.   On: 08/19/2015 20:43   I have personally reviewed and evaluated these images and lab results as part of my medical decision-making.   EKG Interpretation   Date/Time:  Monday August 19 2015 19:51:09 EST Ventricular Rate:  92 PR Interval:  116 QRS Duration: 96 QT Interval:  363 QTC Calculation: 449 R Axis:   81 Text Interpretation:  Sinus rhythm Borderline short PR interval RSR' in V1  or V2, probably normal variant Confirmed by [facility] MD, Barbara CowerJASON [phone]) on  08/19/2015 8:35:51 PM      MDM   Final diagnoses:  Suicidal behavior   30 year old male here with substance abuse and suicidality. Initially slightly tachycardic sig and a liter of fluids and put on serial protocol with a NicoDerm patch. His heart rate improved to the low 90s rest of labs were okay. Doubt any significant cause to his tachycardia rather than just simple  dehydration and anxiety. Patient is medically clear for TTS consult. TTS recommends in-patient care awaiting placement. Will IVC papers at this time as the patient is here voluntarily.     Marily MemosJason Fardeen Steinberger, MD 08/19/15 2317  Marily MemosJason Meryle Pugmire, MD 08/19/15 [phone]

## 2015-08-19 NOTE — ED Notes (Signed)
Staffing office notified of need for sitter. 

## 2015-08-19 NOTE — ED Notes (Signed)
States he was here 1 month ago for a suicidal attempt and signed himself out.

## 2015-08-19 NOTE — ED Notes (Signed)
Pt requesting detox from alcohol, opiates, amphetamines, cocaine and marijuana. Last drug use was amphetamines at 1000. States he drank 1/2 pint of beer today as well. Expresses suicidal ideation without a plan.
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
## 2015-08-20 ENCOUNTER — Observation Stay (HOSPITAL_COMMUNITY)
Admission: AD | Admit: 2015-08-20 | Discharge: 2015-08-20 | Disposition: A | Discharge status: Home / Self Care | Payer: Federal, State, Local not specified - Other | Source: Intra-hospital | Attending: Psychiatry | Admitting: Psychiatry

## 2015-08-20 MED ORDER — LORAZEPAM 2 MG/ML IJ SOLN: 0.0000 mg | Freq: Two times a day (BID) | INTRAMUSCULAR | Status: DC

## 2015-08-20 MED ORDER — CLONIDINE HCL 0.1 MG PO TABS: 0.1000 mg | ORAL_TABLET | ORAL | Status: DC

## 2015-08-20 MED ORDER — NAPROXEN 250 MG PO TABS: 500.0000 mg | ORAL_TABLET | Freq: Two times a day (BID) | ORAL | Status: DC | PRN

## 2015-08-20 MED ORDER — ONDANSETRON 4 MG PO TBDP: 4.0000 mg | ORAL_TABLET | Freq: Four times a day (QID) | ORAL | Status: DC | PRN

## 2015-08-20 MED ORDER — DICYCLOMINE HCL 20 MG PO TABS: 20.0000 mg | ORAL_TABLET | Freq: Four times a day (QID) | ORAL | Status: DC | PRN

## 2015-08-20 MED ORDER — HYDROXYZINE HCL 25 MG PO TABS: 25.0000 mg | ORAL_TABLET | Freq: Four times a day (QID) | ORAL | Status: DC | PRN

## 2015-08-20 MED ORDER — CLONIDINE HCL 0.1 MG PO TABS
0.1000 mg | ORAL_TABLET | Freq: Every day | ORAL | Status: DC
Start: 2015-08-24 — End: 2015-08-20

## 2015-08-20 MED ORDER — METHOCARBAMOL 500 MG PO TABS: 500.0000 mg | ORAL_TABLET | Freq: Three times a day (TID) | ORAL | Status: DC | PRN

## 2015-08-20 MED ORDER — LOPERAMIDE HCL 2 MG PO CAPS: 2.0000 mg | ORAL_CAPSULE | ORAL | Status: DC | PRN

## 2015-08-20 MED ORDER — CLONIDINE HCL 0.1 MG PO TABS: 0.1000 mg | ORAL_TABLET | Freq: Four times a day (QID) | ORAL | Status: DC

## 2015-08-20 MED ORDER — LORAZEPAM 1 MG PO TABS: 0.0000 mg | ORAL_TABLET | Freq: Two times a day (BID) | ORAL | Status: DC

## 2015-08-20 NOTE — ED Notes (Signed)
[place] w[place] [employer] [employer] - attempting to speak w/his Research scientist (life sciences)ederal Probation Officer - Vivianne SpenceBradley Trogdon.

## 2015-08-20 NOTE — BH Assessment (Signed)
[facility]. Patient states "I didn't want to stay for 24 hours!" Patient denies SI, HI and AVH at this time. Patient states "my girlfriend lives walking distance from here, I will go to her house." Patient signed AMA document after explanation with read aloud. Belongings returned to patient, patient left without complaint.

## 2015-08-20 NOTE — ED Provider Notes (Signed)
Patient is a heroin abuser, while he is here nurse is requesting opiate detox protocol. Adequate BP at this time, will place orders for clonidine and symptomatic care  Pricilla LovelessScott Garyson Stelly, MD 08/20/15 [phone]

## 2015-08-20 NOTE — Progress Notes (Signed)
[facility] Dr. Lucianne MussKumar- admission is voluntary and pt can arrive anytime. Number for report is [phone].   Ilean SkillMeghan Celedonio Sortino, MSW, LCSW Clinical Social Work, Disposition  08/20/2015 [phone]

## 2015-08-20 NOTE — ED Notes (Signed)
[facility] - signed consent forms [facility]

## 2017-03-03 IMAGING — DX DG CHEST 2V
2 series · 2 of 2 positions shown · non-contrast
Comparison: None.

CLINICAL DATA: Drug abuse, requesting detox. Amphetamine use this
morning. Suicidal ideation. Hypoalbuminemia and leukocytosis, assess
for infection.

EXAM:
CHEST  2 VIEW

[w chest lat]
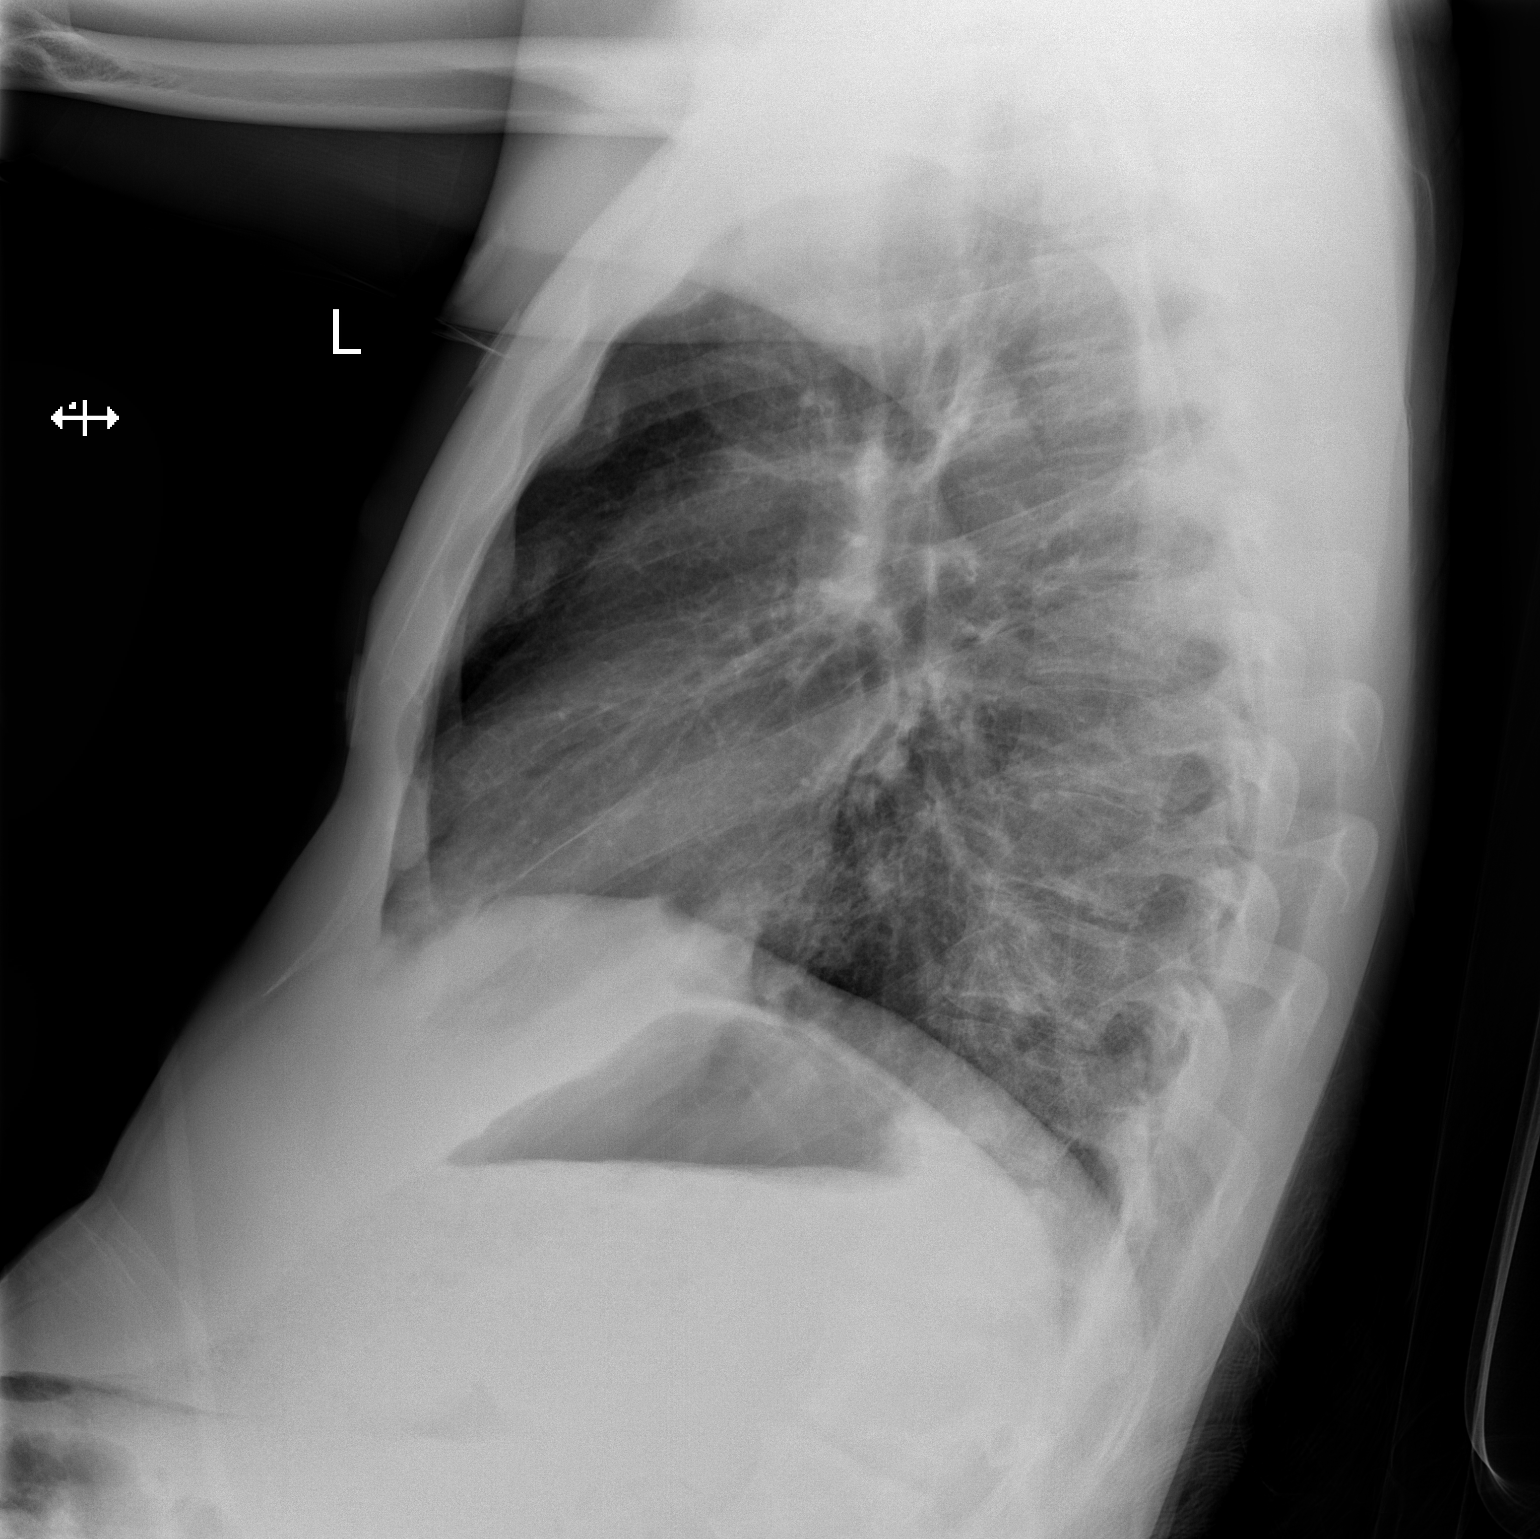

[x chest ap]
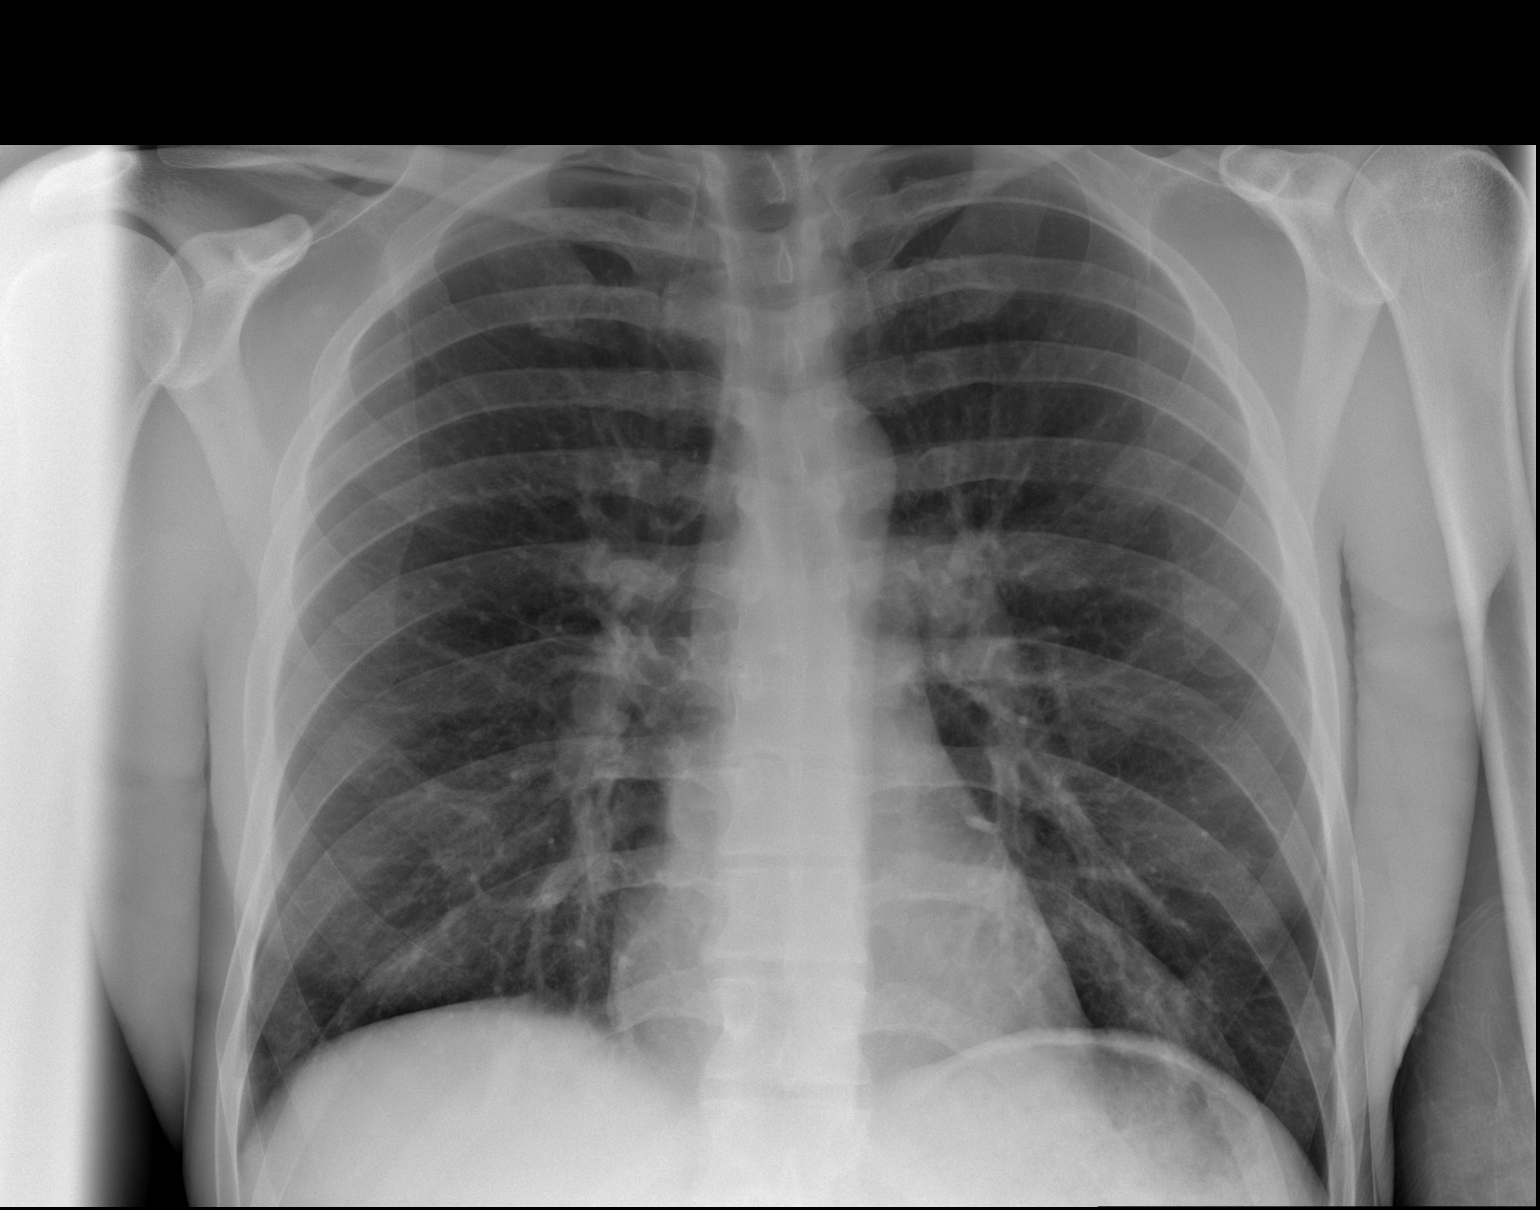

[2 of 2 positions shown; findings below may reference images not displayed]

FINDINGS: The lungs appear clear. No evidence of active pulmonary
tuberculosis.

Cardiac and mediastinal margins appear normal.  No pleural effusion.
IMPRESSION: 1. No significant thoracic abnormality observed.
